# Patient Record
Sex: Female | Born: 1965 | Race: White | Hispanic: No | Marital: Married | Smoking: Former smoker
Health system: Southern US, Community
[De-identification: ages and names within clinical notes are randomized; demographics above are authoritative.]

## PROBLEM LIST (undated history)

## (undated) DIAGNOSIS — I959 Hypotension, unspecified: Secondary | ICD-10-CM

## (undated) DIAGNOSIS — E079 Disorder of thyroid, unspecified: Secondary | ICD-10-CM

## (undated) DIAGNOSIS — M8580 Other specified disorders of bone density and structure, unspecified site: Secondary | ICD-10-CM

## (undated) HISTORY — PX: TONSILLECTOMY: SUR1361

## (undated) HISTORY — PX: BUNIONECTOMY: SHX129

## (undated) HISTORY — DX: Disorder of thyroid, unspecified: E07.9

## (undated) HISTORY — DX: Other specified disorders of bone density and structure, unspecified site: M85.80

## (undated) HISTORY — PX: OTHER SURGICAL HISTORY: SHX169

## (undated) HISTORY — DX: Hypotension, unspecified: I95.9

---

## 1999-09-24 ENCOUNTER — Other Ambulatory Visit: Admission: RE | Admit: 1999-09-24 | Discharge: 1999-09-24 | Payer: Self-pay | Admitting: Gynecology

## 2001-04-13 ENCOUNTER — Encounter: Payer: Self-pay | Admitting: Gynecology

## 2001-04-13 ENCOUNTER — Encounter: Admission: RE | Admit: 2001-04-13 | Discharge: 2001-04-13 | Payer: Self-pay | Admitting: Gynecology

## 2002-12-16 ENCOUNTER — Other Ambulatory Visit: Admission: RE | Admit: 2002-12-16 | Discharge: 2002-12-16 | Payer: Self-pay | Admitting: Gynecology

## 2003-12-09 ENCOUNTER — Other Ambulatory Visit: Admission: RE | Admit: 2003-12-09 | Discharge: 2003-12-09 | Payer: Self-pay | Admitting: Gynecology

## 2004-04-01 ENCOUNTER — Encounter: Admission: RE | Admit: 2004-04-01 | Discharge: 2004-04-01 | Payer: Self-pay | Admitting: Gynecology

## 2004-12-06 ENCOUNTER — Other Ambulatory Visit: Admission: RE | Admit: 2004-12-06 | Discharge: 2004-12-06 | Payer: Self-pay | Admitting: Gynecology

## 2005-04-11 ENCOUNTER — Encounter: Admission: RE | Admit: 2005-04-11 | Discharge: 2005-04-11 | Payer: Self-pay | Admitting: Gynecology

## 2005-04-20 ENCOUNTER — Encounter: Admission: RE | Admit: 2005-04-20 | Discharge: 2005-04-20 | Payer: Self-pay | Admitting: Gynecology

## 2006-05-16 ENCOUNTER — Encounter: Admission: RE | Admit: 2006-05-16 | Discharge: 2006-05-16 | Payer: Self-pay | Admitting: Specialist

## 2007-07-12 ENCOUNTER — Encounter: Admission: RE | Admit: 2007-07-12 | Discharge: 2007-07-12 | Payer: Self-pay | Admitting: Family Medicine

## 2008-05-08 ENCOUNTER — Other Ambulatory Visit: Admission: RE | Admit: 2008-05-08 | Discharge: 2008-05-08 | Payer: Self-pay | Admitting: Obstetrics and Gynecology

## 2008-07-22 ENCOUNTER — Encounter: Admission: RE | Admit: 2008-07-22 | Discharge: 2008-07-22 | Payer: Self-pay | Admitting: Family Medicine

## 2009-07-08 ENCOUNTER — Encounter: Admission: RE | Admit: 2009-07-08 | Discharge: 2009-07-08 | Payer: Self-pay | Admitting: Specialist

## 2010-07-09 ENCOUNTER — Encounter: Admission: RE | Admit: 2010-07-09 | Discharge: 2010-07-09 | Payer: Self-pay | Admitting: Specialist

## 2011-10-07 ENCOUNTER — Other Ambulatory Visit: Payer: Self-pay | Admitting: Specialist

## 2011-10-07 DIAGNOSIS — Z1231 Encounter for screening mammogram for malignant neoplasm of breast: Secondary | ICD-10-CM

## 2011-11-07 ENCOUNTER — Ambulatory Visit
Admission: RE | Admit: 2011-11-07 | Discharge: 2011-11-07 | Disposition: A | Discharge status: Home / Self Care | Payer: BC Managed Care – PPO | Source: Ambulatory Visit | Attending: Specialist | Admitting: Specialist

## 2011-11-07 ENCOUNTER — Ambulatory Visit: Payer: Self-pay

## 2011-11-07 DIAGNOSIS — Z1231 Encounter for screening mammogram for malignant neoplasm of breast: Secondary | ICD-10-CM

## 2012-02-02 ENCOUNTER — Ambulatory Visit
Admission: RE | Admit: 2012-02-02 | Discharge: 2012-02-02 | Disposition: A | Discharge status: Home / Self Care | Payer: BC Managed Care – PPO | Source: Ambulatory Visit | Attending: Otolaryngology | Admitting: Otolaryngology

## 2012-02-02 ENCOUNTER — Other Ambulatory Visit: Payer: Self-pay | Admitting: Otolaryngology

## 2012-02-02 DIAGNOSIS — R0981 Nasal congestion: Secondary | ICD-10-CM

## 2012-10-10 ENCOUNTER — Other Ambulatory Visit: Payer: Self-pay | Admitting: Specialist

## 2012-10-10 DIAGNOSIS — Z803 Family history of malignant neoplasm of breast: Secondary | ICD-10-CM

## 2012-10-10 DIAGNOSIS — Z1231 Encounter for screening mammogram for malignant neoplasm of breast: Secondary | ICD-10-CM

## 2012-11-23 ENCOUNTER — Ambulatory Visit: Payer: BC Managed Care – PPO

## 2012-12-27 ENCOUNTER — Telehealth: Payer: Self-pay | Admitting: Nurse Practitioner

## 2012-12-28 ENCOUNTER — Other Ambulatory Visit: Payer: Self-pay | Admitting: Nurse Practitioner

## 2012-12-28 MED ORDER — LEVOTHYROXINE SODIUM 125 MCG PO TABS: 125.0000 ug | ORAL_TABLET | Freq: Every day | ORAL | Status: DC

## 2012-12-28 NOTE — Telephone Encounter (Signed)
Levothyroxine level increased to - Need to recheck labs in 3 months

## 2012-12-28 NOTE — Telephone Encounter (Signed)
LABS ARE ON YOUR SHELF

## 2012-12-28 NOTE — Telephone Encounter (Signed)
Chart on desk

## 2012-12-28 NOTE — Telephone Encounter (Signed)
Need to see chart. Is patient already on thyroid medicine

## 2012-12-28 NOTE — Telephone Encounter (Signed)
PT AWARE  

## 2013-01-03 ENCOUNTER — Other Ambulatory Visit: Payer: Self-pay | Admitting: Nurse Practitioner

## 2013-01-03 ENCOUNTER — Ambulatory Visit
Admission: RE | Admit: 2013-01-03 | Discharge: 2013-01-03 | Disposition: A | Discharge status: Home / Self Care | Payer: BC Managed Care – PPO | Source: Ambulatory Visit | Attending: Specialist | Admitting: Specialist

## 2013-01-03 ENCOUNTER — Other Ambulatory Visit (HOSPITAL_COMMUNITY)
Admission: RE | Admit: 2013-01-03 | Discharge: 2013-01-03 | Disposition: A | Discharge status: Home / Self Care | Payer: BC Managed Care – PPO | Source: Ambulatory Visit | Attending: Obstetrics and Gynecology | Admitting: Obstetrics and Gynecology

## 2013-01-03 DIAGNOSIS — Z1231 Encounter for screening mammogram for malignant neoplasm of breast: Secondary | ICD-10-CM

## 2013-01-03 DIAGNOSIS — Z1151 Encounter for screening for human papillomavirus (HPV): Secondary | ICD-10-CM | POA: Insufficient documentation

## 2013-01-03 DIAGNOSIS — Z01419 Encounter for gynecological examination (general) (routine) without abnormal findings: Secondary | ICD-10-CM | POA: Insufficient documentation

## 2013-01-03 DIAGNOSIS — Z803 Family history of malignant neoplasm of breast: Secondary | ICD-10-CM

## 2013-01-09 ENCOUNTER — Other Ambulatory Visit: Payer: Self-pay | Admitting: Dermatology

## 2013-04-24 ENCOUNTER — Other Ambulatory Visit (INDEPENDENT_AMBULATORY_CARE_PROVIDER_SITE_OTHER): Payer: BC Managed Care – PPO

## 2013-04-24 DIAGNOSIS — E039 Hypothyroidism, unspecified: Secondary | ICD-10-CM

## 2013-04-25 LAB — TSH: TSH: 1.75 u[IU]/mL (ref 0.450–4.500)

## 2013-04-30 ENCOUNTER — Telehealth: Payer: Self-pay | Admitting: Nurse Practitioner

## 2013-04-30 NOTE — Telephone Encounter (Signed)
Patient aware of labs please sent thyroid med to [facility] pLEASE SEND 3 MTHS

## 2013-05-01 MED ORDER — LEVOTHYROXINE SODIUM 125 MCG PO TABS: 125.0000 ug | ORAL_TABLET | Freq: Every day | ORAL | Status: DC

## 2013-05-01 NOTE — Telephone Encounter (Signed)
Thyroid med rx sent to [facility]

## 2013-05-11 ENCOUNTER — Encounter: Payer: Self-pay | Admitting: Family Medicine

## 2013-05-11 ENCOUNTER — Ambulatory Visit (INDEPENDENT_AMBULATORY_CARE_PROVIDER_SITE_OTHER): Payer: BC Managed Care – PPO | Admitting: Family Medicine

## 2013-05-11 VITALS — BP 111/70 | HR 62 | Temp 99.4°F | Ht 65.0 in | Wt 127.0 lb

## 2013-05-11 DIAGNOSIS — J309 Allergic rhinitis, unspecified: Secondary | ICD-10-CM

## 2013-05-11 DIAGNOSIS — J302 Other seasonal allergic rhinitis: Secondary | ICD-10-CM

## 2013-05-11 DIAGNOSIS — J329 Chronic sinusitis, unspecified: Secondary | ICD-10-CM

## 2013-05-11 MED ORDER — AMOXICILLIN-POT CLAVULANATE 875-125 MG PO TABS: 1.0000 | ORAL_TABLET | Freq: Two times a day (BID) | ORAL | Status: DC

## 2013-05-11 MED ORDER — MONTELUKAST SODIUM 10 MG PO TABS: 10.0000 mg | ORAL_TABLET | Freq: Every day | ORAL | Status: DC

## 2013-05-11 MED ORDER — METHYLPREDNISOLONE (PAK) 4 MG PO TABS: ORAL_TABLET | ORAL | Status: DC

## 2013-05-11 MED ORDER — FLUCONAZOLE 150 MG PO TABS: 150.0000 mg | ORAL_TABLET | Freq: Once | ORAL | Status: DC

## 2013-05-11 NOTE — Patient Instructions (Signed)

## 2013-05-11 NOTE — Progress Notes (Signed)
  Subjective:    Patient ID: DENNY LAVE, female    DOB: 1966/02/26, 46 y.o.   MRN: [id number]  HPI This 47 y.o. female presents for evaluation of sinus congestion and fever.  She has had URI Sx's for over 2 weeks now..   Review of Systems C/o uri sx's No chest pain, SOB, HA, dizziness, vision change, N/V, diarrhea, constipation, dysuria, urinary urgency or frequency, myalgias, arthralgias or rash.     Objective:   Physical Exam  Vital signs noted  Well developed well nourished female.  HEENT - Head atraumatic Normocephalic                Eyes - PERRLA, Conjuctiva - clear Sclera- Clear EOMI                Ears - EAC's Wnl TM's Wnl Gross Hearing WNL                Nose - Nares patent                 Throat - oropharanx wnl Respiratory - Lungs CTA bilateral Cardiac - RRR S1 and S2 without murmur GI - Abdomen soft Nontender and bowel sounds active x 4 Extremities - No edema. Neuro - Grossly intact.      Assessment & Plan:  Sinusitis - Plan: amoxicillin-clavulanate (AUGMENTIN) 875-125 MG per tablet, methylPREDNIsolone (MEDROL DOSPACK) 4 MG tablet, montelukast (SINGULAIR) 10 MG tablet  Seasonal allergies - Plan: montelukast (SINGULAIR) 10 MG tablet

## 2013-06-08 ENCOUNTER — Ambulatory Visit (INDEPENDENT_AMBULATORY_CARE_PROVIDER_SITE_OTHER): Payer: BC Managed Care – PPO | Admitting: Family Medicine

## 2013-06-08 VITALS — BP 128/81 | HR 87 | Temp 97.9°F | Ht 65.0 in | Wt 122.0 lb

## 2013-06-08 DIAGNOSIS — J329 Chronic sinusitis, unspecified: Secondary | ICD-10-CM

## 2013-06-08 MED ORDER — AMOXICILLIN-POT CLAVULANATE 875-125 MG PO TABS: 1.0000 | ORAL_TABLET | Freq: Two times a day (BID) | ORAL | Status: DC

## 2013-06-08 MED ORDER — METHYLPREDNISOLONE (PAK) 4 MG PO TABS: ORAL_TABLET | ORAL | Status: DC

## 2013-06-08 NOTE — Progress Notes (Signed)
  Subjective:    Patient ID: April Herrera, female    DOB: 09-24-1965, 47 y.o.   MRN: [id number]  HPI This 47 y.o. female presents for evaluation of uri sx's for over a week.  She  Was tx'd remotely for sinus infection.  She has been having allergy difficulties Since moving to [place].   Review of Systems C/o uri sx's and allergies. No chest pain, SOB, HA, dizziness, vision change, N/V, diarrhea, constipation, dysuria, urinary urgency or frequency, myalgias, arthralgias or rash.     Objective:   Physical Exam  Vital signs noted  Well developed well nourished female.  HEENT - Head atraumatic Normocephalic                Eyes - PERRLA, Conjuctiva - clear Sclera- Clear EOMI                Ears - EAC's Wnl TM's Wnl Gross Hearing WNL                Nose - Nares patent                 Throat - oropharanx wnl                 Face - TTP maxillary sinuses. Respiratory - Lungs CTA bilateral Cardiac - RRR S1 and S2 without murmur GI - Abdomen soft Nontender and bowel sounds active x 4 Extremities - No edema. Neuro - Grossly intact.      Assessment & Plan:  Sinusitis - Plan: amoxicillin-clavulanate (AUGMENTIN) 875-125 MG per tablet, methylPREDNIsolone (MEDROL DOSPACK) 4 MG tablet.  May need referral to ENT if infections persist.  Encouraged her To continue singulair, otc allegra D, and follow up prn if sx's continue or persist.

## 2013-06-08 NOTE — Patient Instructions (Signed)

## 2013-07-23 ENCOUNTER — Telehealth: Payer: Self-pay | Admitting: Family Medicine

## 2013-07-23 ENCOUNTER — Encounter (INDEPENDENT_AMBULATORY_CARE_PROVIDER_SITE_OTHER): Payer: Self-pay

## 2013-07-23 ENCOUNTER — Ambulatory Visit (INDEPENDENT_AMBULATORY_CARE_PROVIDER_SITE_OTHER): Payer: BC Managed Care – PPO | Admitting: Family Medicine

## 2013-07-23 ENCOUNTER — Encounter: Payer: Self-pay | Admitting: Family Medicine

## 2013-07-23 VITALS — BP 118/73 | HR 67 | Temp 98.1°F | Ht 65.0 in | Wt 123.0 lb

## 2013-07-23 DIAGNOSIS — H60399 Other infective otitis externa, unspecified ear: Secondary | ICD-10-CM

## 2013-07-23 DIAGNOSIS — R062 Wheezing: Secondary | ICD-10-CM

## 2013-07-23 DIAGNOSIS — J329 Chronic sinusitis, unspecified: Secondary | ICD-10-CM

## 2013-07-23 DIAGNOSIS — H6092 Unspecified otitis externa, left ear: Secondary | ICD-10-CM

## 2013-07-23 MED ORDER — NEOMYCIN-POLYMYXIN-HC 3.5-10000-1 OT SOLN: 3.0000 [drp] | Freq: Four times a day (QID) | OTIC | Status: DC

## 2013-07-23 MED ORDER — AZITHROMYCIN 250 MG PO TABS: ORAL_TABLET | ORAL | Status: DC

## 2013-07-23 MED ORDER — ALBUTEROL SULFATE HFA 108 (90 BASE) MCG/ACT IN AERS: 2.0000 | INHALATION_SPRAY | RESPIRATORY_TRACT | Status: DC | PRN

## 2013-07-23 MED ORDER — METHYLPREDNISOLONE ACETATE 40 MG/ML IJ SUSP: 80.0000 mg | Freq: Once | INTRAMUSCULAR | Status: DC

## 2013-07-23 MED ORDER — METHYLPREDNISOLONE ACETATE 80 MG/ML IJ SUSP
80.0000 mg | Freq: Once | INTRAMUSCULAR | Status: AC
  Administered 2013-07-23: 80 mg via INTRAMUSCULAR

## 2013-07-23 NOTE — Telephone Encounter (Signed)
Appt given for today 

## 2013-07-23 NOTE — Patient Instructions (Signed)
Methylprednisolone Suspension for Injection What is this medicine? METHYLPREDNISOLONE (meth ill pred NISS oh lone) is a corticosteroid. It is commonly used to treat inflammation of the skin, joints, lungs, and other organs. Common conditions treated include asthma, allergies, and arthritis. It is also used for other conditions, such as blood disorders and diseases of the adrenal glands. This medicine may be used for other purposes; ask your health care provider or pharmacist if you have questions. What should I tell my health care provider before I take this medicine? They need to know if you have any of these conditions: -cataracts or glaucoma -Cushings -heart disease -high blood pressure -infection including tuberculosis -low calcium or potassium levels in the blood -recent surgery -seizures -stomach or intestinal disease, including colitis -threadworms -thyroid problems -an unusual or allergic reaction to methylprednisolone, corticosteroids, benzyl alcohol, other medicines, foods, dyes, or preservatives -pregnant or trying to get pregnant -breast-feeding How should I use this medicine? This medicine is for injection into a muscle, joint, or other tissue. It is given by a health care professional in a hospital or clinic setting. Talk to your pediatrician regarding the use of this medicine in children. While this drug may be prescribed for selected conditions, precautions do apply. Overdosage: If you think you have taken too much of this medicine contact a poison control center or emergency room at once. NOTE: This medicine is only for you. Do not share this medicine with others. What if I miss a dose? This does not apply. What may interact with this medicine? Do not take this medicine with any of the following medications: -mifepristone -radiopaque contrast agents This medicine may also interact with the following medications: -aspirin and aspirin-like  medicines -cyclosporin -ketoconazole -phenobarbital -phenytoin -rifampin -tacrolimus -troleandomycin -vaccines -warfarin This list may not describe all possible interactions. Give your health care provider a list of all the medicines, herbs, non-prescription drugs, or dietary supplements you use. Also tell them if you smoke, drink alcohol, or use illegal drugs. Some items may interact with your medicine. What should I watch for while using this medicine? Visit your doctor or health care professional for regular checks on your progress. If you are taking this medicine for a long time, carry an identification card with your name and address, the type and dose of your medicine, and your doctor's name and address. The medicine may increase your risk of getting an infection. Stay away from people who are sick. Tell your doctor or health care professional if you are around anyone with measles or chickenpox. You may need to avoid some vaccines. Talk to your health care provider for more information. If you are going to have surgery, tell your doctor or health care professional that you have taken this medicine within the last twelve months. Ask your doctor or health care professional about your diet. You may need to lower the amount of salt you eat. The medicine can increase your blood sugar. If you are a diabetic check with your doctor if you need help adjusting the dose of your diabetic medicine. What side effects may I notice from receiving this medicine? Side effects that you should report to your doctor or health care professional as soon as possible: -allergic reactions like skin rash, itching or hives, swelling of the face, lips, or tongue -bloody or tarry stools -changes in vision -eye pain or bulging eyes -fever, sore throat, sneezing, cough, or other signs of infection, wounds that will not heal -increased thirst -irregular heartbeat -muscle cramps -pain   in hips, back, ribs, arms,  shoulders, or legs -swelling of the ankles, feet, hands -trouble passing urine or change in the amount of urine -unusual bleeding or bruising -unusually weak or tired -weight gain or weight loss Side effects that usually do not require medical attention (report to your doctor or health care professional if they continue or are bothersome): -changes in emotions or moods -constipation or diarrhea -headache -irritation at site where injected -nausea, vomiting -skin problems, acne, thin and shiny skin -trouble sleeping -unusual hair growth on the face or body This list may not describe all possible side effects. Call your doctor for medical advice about side effects. You may report side effects to FDA at 1-800-FDA-1088. Where should I keep my medicine? This drug is given in a hospital or clinic and will not be stored at home. NOTE: This sheet is a summary. It may not cover all possible information. If you have questions about this medicine, talk to your doctor, pharmacist, or health care provider.  2013, Elsevier/Gold Standard. (03/26/2008 2:36:31 PM)  

## 2013-07-23 NOTE — Progress Notes (Signed)
  Subjective:    Patient ID: April Herrera, female    DOB: [birth year], 47 y.o.   MRN: [id number]  HPI SINUSITIS Onset:  1-2 weeks  Location: bilateral maxillary sinuses, L>R Description:sinus pressure, mucopurulent drainage- mild URI 1-2 weeks prios to sxs   Modifying factors: mild wheezing/ches tighness in setting of prior 15 pack year smoking history.   Symptoms Cough:  yes Discharge:  yes Fever: no Sinus Pressure:  yes Ears Blocked:  yes Teeth Ache:  Minimal  Frontal Headache:  yes Second Sickening:  Within grey zone   Red Flags Change in mental state: no Change in vision: no   Review of Systems  All other systems reviewed and are negative.       Objective:   Physical Exam  Constitutional: She appears well-developed and well-nourished.  HENT:  Head: Normocephalic and atraumatic.  Right Ear: External ear normal.  L ear canal erythema and tenderness to otoscopic evaluation.  Bilateral maxillary sinus TTP +nasal erythema, rhinorrhea bilaterally, + post oropharyngeal erythema    Eyes: Conjunctivae are normal. Pupils are equal, round, and reactive to light.  Neck: Normal range of motion. Neck supple.  Cardiovascular: Normal rate and regular rhythm.   Pulmonary/Chest: Effort normal.  Faint wheezing in apices    Abdominal: Soft.  Musculoskeletal: Normal range of motion.  Lymphadenopathy:    She has no cervical adenopathy.  Neurological: She is alert.  Skin: Skin is warm.          Assessment & Plan:  Sinusitis - Plan: azithromycin (ZITHROMAX) 250 MG tablet  Wheezing - Plan: methylPREDNISolone acetate (DEPO-MEDROL) injection 80 mg  OE (otitis externa), left - Plan: neomycin-polymyxin-hydrocortisone (CORTISPORIN) otic solution  Treatment plan as above.  Discussed supportive care and infectious/resp/ENT red flags.  Consider PFTs if wheezing/chest tightness persist with resolution of sinusitis in 2-3 months.  Prn albuterol RX given.  Restart allergic  rhinitis regimen.  Follow up as needed.

## 2013-12-09 ENCOUNTER — Telehealth: Payer: Self-pay | Admitting: Family Medicine

## 2013-12-09 NOTE — Telephone Encounter (Signed)
 SinkRita will call pt

## 2014-02-28 IMAGING — MG MM DIGITAL SCREENING BILAT W/ CAD
5 series · 5 of 5 positions shown · non-contrast
Comparison: Previous exams.

CLINICAL DATA: Screening.

DIGITAL BILATERAL SCREENING MAMMOGRAM WITH CAD

[R CC]
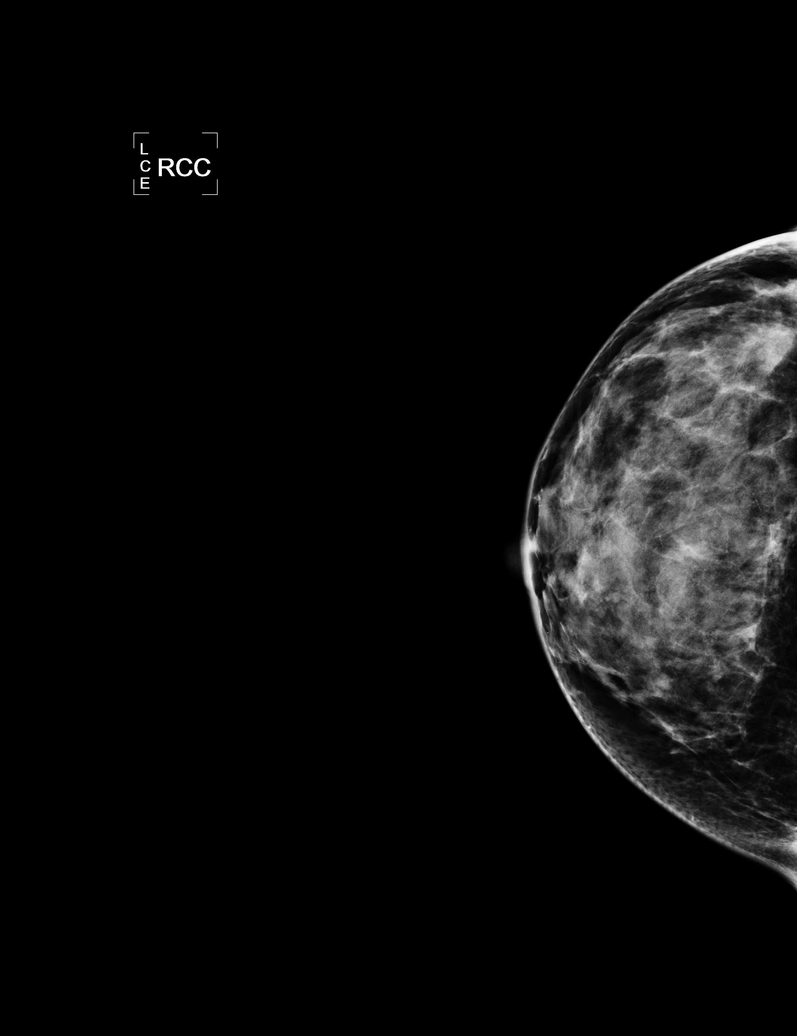

[L CC]
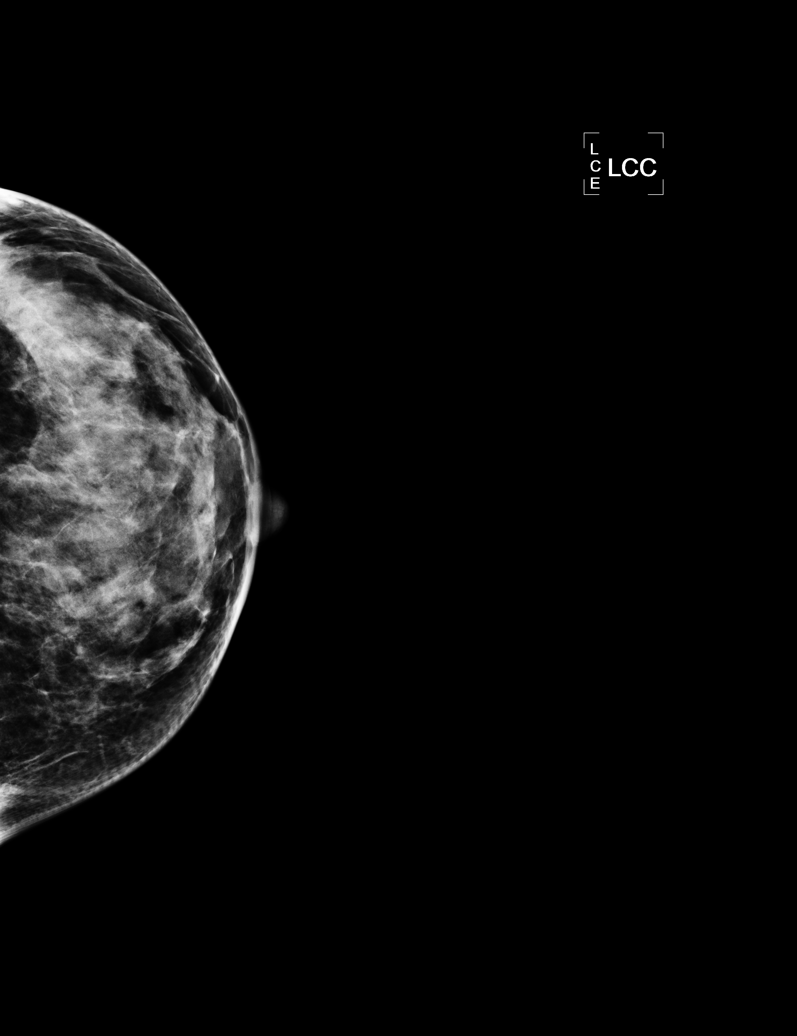

[L MLO]
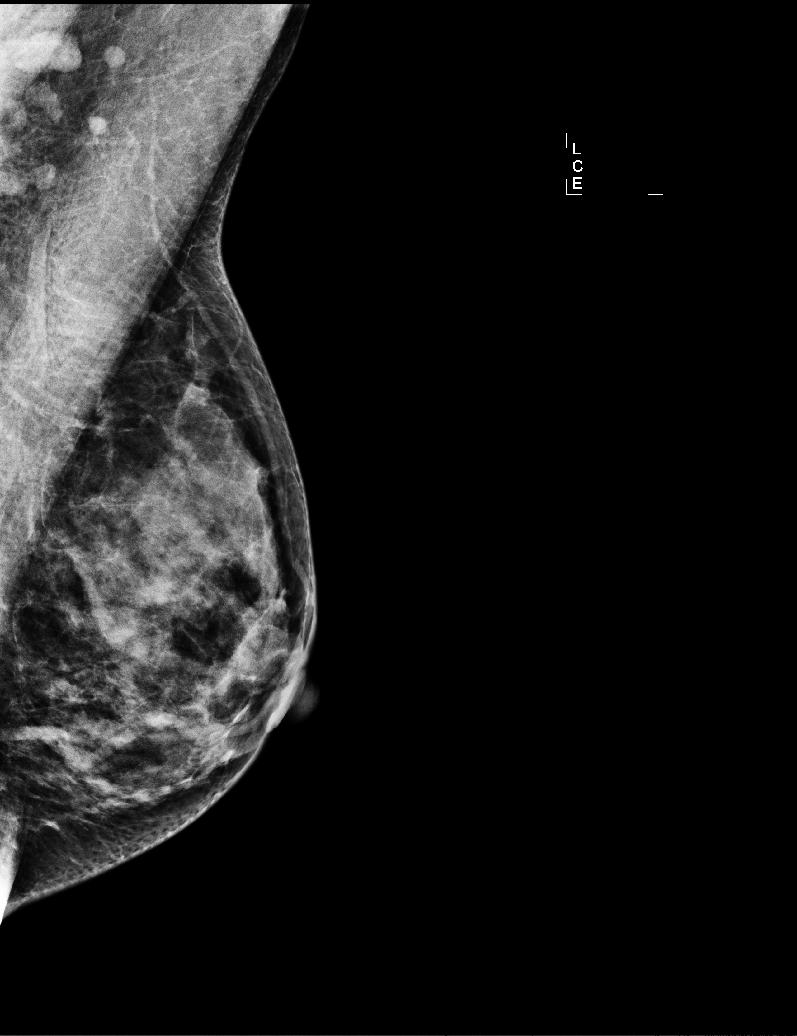

[R MLO (1 of 2)]
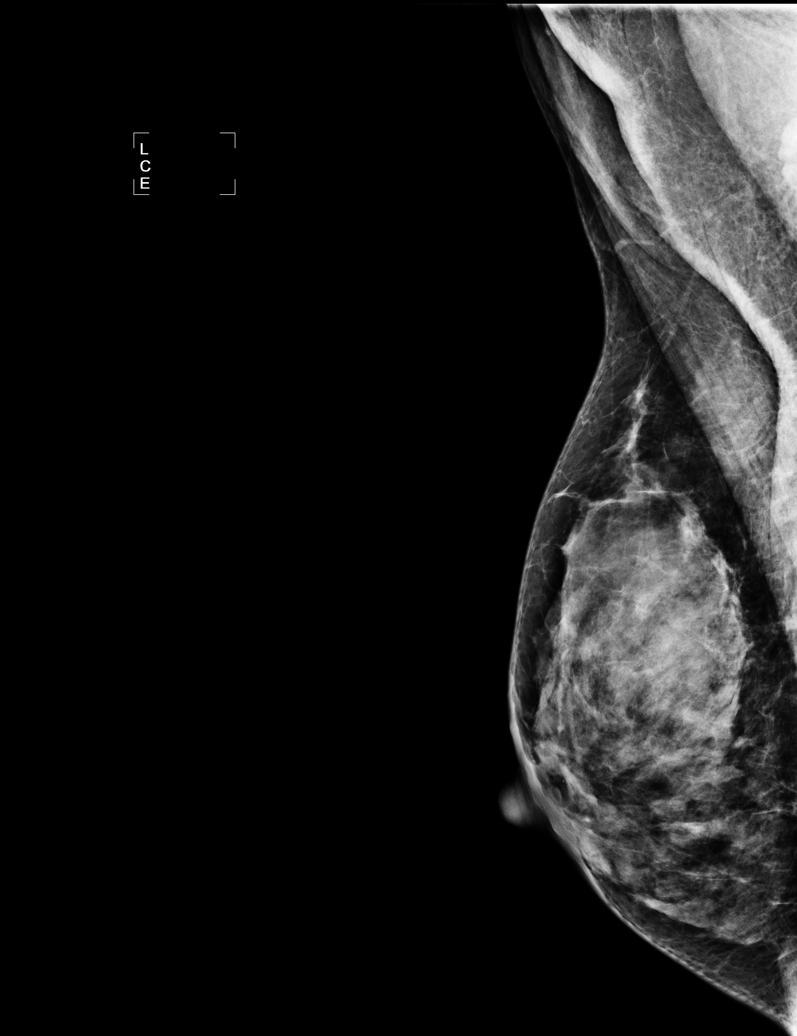

[R MLO (2 of 2)]
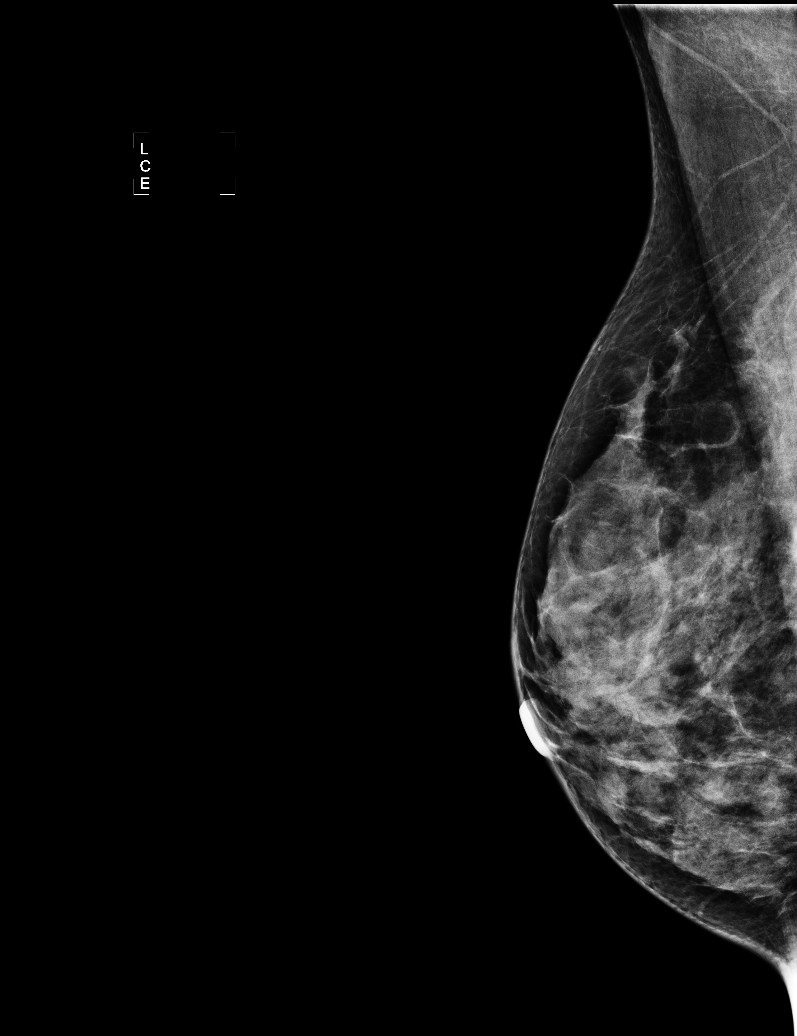

[5 of 5 positions shown; findings below may reference images not displayed]

FINDINGS: ACR Breast Density Category 3: The breast tissue is heterogeneously
dense.

No suspicious masses, architectural distortion, or calcifications
are present.

Images were processed with CAD.
IMPRESSION: No mammographic evidence of malignancy.

A result letter of this screening mammogram will be mailed directly
to the patient.

RECOMMENDATION:
Screening mammogram in one year. (Code:S5-2-VZT)

BI-RADS CATEGORY 1:  Negative.

## 2014-03-03 ENCOUNTER — Other Ambulatory Visit: Payer: Self-pay | Admitting: *Deleted

## 2014-03-03 ENCOUNTER — Telehealth: Payer: Self-pay | Admitting: Family Medicine

## 2014-03-03 DIAGNOSIS — J329 Chronic sinusitis, unspecified: Secondary | ICD-10-CM

## 2014-03-03 MED ORDER — LEVOTHYROXINE SODIUM 125 MCG PO TABS: 125.0000 ug | ORAL_TABLET | Freq: Every day | ORAL | Status: DC

## 2014-03-03 MED ORDER — AZITHROMYCIN 250 MG PO TABS: ORAL_TABLET | ORAL | Status: DC

## 2014-03-03 NOTE — Telephone Encounter (Signed)
One refill sent in

## 2014-03-03 NOTE — Telephone Encounter (Signed)
Do not do refills on antibiotics- NTBS  

## 2014-03-03 NOTE — Telephone Encounter (Signed)
Last ov 11/15

## 2014-03-06 ENCOUNTER — Other Ambulatory Visit: Payer: Self-pay | Admitting: Nurse Practitioner

## 2014-03-19 ENCOUNTER — Other Ambulatory Visit: Payer: Self-pay

## 2014-03-19 DIAGNOSIS — Z803 Family history of malignant neoplasm of breast: Secondary | ICD-10-CM

## 2014-03-19 DIAGNOSIS — Z1231 Encounter for screening mammogram for malignant neoplasm of breast: Secondary | ICD-10-CM

## 2014-03-28 ENCOUNTER — Ambulatory Visit
Admission: RE | Admit: 2014-03-28 | Discharge: 2014-03-28 | Disposition: A | Discharge status: Home / Self Care | Payer: BC Managed Care – PPO | Source: Ambulatory Visit

## 2014-03-28 DIAGNOSIS — Z1231 Encounter for screening mammogram for malignant neoplasm of breast: Secondary | ICD-10-CM

## 2014-03-28 DIAGNOSIS — Z803 Family history of malignant neoplasm of breast: Secondary | ICD-10-CM

## 2014-04-01 ENCOUNTER — Encounter: Payer: Self-pay | Admitting: Family Medicine

## 2014-04-01 ENCOUNTER — Ambulatory Visit (INDEPENDENT_AMBULATORY_CARE_PROVIDER_SITE_OTHER): Payer: BC Managed Care – PPO | Admitting: Family Medicine

## 2014-04-01 VITALS — BP 120/76 | HR 60 | Temp 99.1°F | Ht 65.0 in | Wt 122.4 lb

## 2014-04-01 DIAGNOSIS — E039 Hypothyroidism, unspecified: Secondary | ICD-10-CM

## 2014-04-01 DIAGNOSIS — F411 Generalized anxiety disorder: Secondary | ICD-10-CM

## 2014-04-01 DIAGNOSIS — E559 Vitamin D deficiency, unspecified: Secondary | ICD-10-CM

## 2014-04-01 NOTE — Progress Notes (Signed)
   Subjective:    Patient ID: April Herrera, female    DOB: 04/01/1966, 48 y.o.   MRN: [id number]  HPI This 48 y.o. female presents for evaluation of hypothyroidism. She has hx of vit D def and she has Family hx of osteoporosis.   Review of Systems No chest pain, SOB, HA, dizziness, vision change, N/V, diarrhea, constipation, dysuria, urinary urgency or frequency, myalgias, arthralgias or rash.     Objective:   Physical Exam Vital signs noted  Well developed well nourished female.  HEENT - Head atraumatic Normocephalic                Eyes - PERRLA, Conjuctiva - clear Sclera- Clear EOMI                Ears - EAC's Wnl TM's Wnl Gross Hearing WNL                Nose - Nares patent                 Throat - oropharanx wnl Respiratory - Lungs CTA bilateral Cardiac - RRR S1 and S2 without murmur GI - Abdomen soft Nontender and bowel sounds active x 4 Extremities - No edema. Neuro - Grossly intact.       Assessment & Plan:  Unspecified hypothyroidism - Plan: Thyroid Panel With TSH, Lipid panel, CANCELED: Thyroid Panel With TSH  Anxiety state, unspecified - Plan: Thyroid Panel With TSH, Lipid panel  Unspecified vitamin D deficiency - Plan: Vit D  25 hydroxy (rtn osteoporosis monitoring)  Deatra CanterWilliam J Kwan Shellhammer FNP

## 2014-04-02 ENCOUNTER — Other Ambulatory Visit: Payer: Self-pay | Admitting: Family Medicine

## 2014-04-02 LAB — LIPID PANEL
Chol/HDL Ratio: 2.5 ratio units (ref 0.0–4.4)
Cholesterol, Total: 160 mg/dL (ref 100–199)
HDL: 64 mg/dL (ref >39)
LDL Calculated: 76 mg/dL (ref 0–99)
Triglycerides: 102 mg/dL (ref 0–149)
VLDL Cholesterol Cal: 20 mg/dL (ref 5–40)

## 2014-04-02 LAB — THYROID PANEL WITH TSH
Free Thyroxine Index: 3.3 (ref 1.2–4.9)
T3 Uptake Ratio: 25 % (ref 24–39)
T4, Total: 13.1 ug/dL — ABNORMAL HIGH (ref 4.5–12.0)
TSH: 0.254 u[IU]/mL — ABNORMAL LOW (ref 0.450–4.500)

## 2014-04-02 LAB — VITAMIN D 25 HYDROXY (VIT D DEFICIENCY, FRACTURES): Vit D, 25-Hydroxy: 59.3 ng/mL (ref 30.0–100.0)

## 2014-04-02 MED ORDER — LEVOTHYROXINE SODIUM 100 MCG PO TABS: 100.0000 ug | ORAL_TABLET | Freq: Every day | ORAL | Status: DC

## 2014-06-30 ENCOUNTER — Telehealth: Payer: Self-pay | Admitting: Family Medicine

## 2014-06-30 ENCOUNTER — Other Ambulatory Visit: Payer: Self-pay | Admitting: Family Medicine

## 2014-06-30 DIAGNOSIS — E038 Other specified hypothyroidism: Secondary | ICD-10-CM

## 2014-06-30 NOTE — Telephone Encounter (Signed)
Order for thyroid panel put in and patient called and advised to come and do lab and no appointment necessary.

## 2014-06-30 NOTE — Telephone Encounter (Signed)
Please advise 

## 2014-07-01 ENCOUNTER — Other Ambulatory Visit (INDEPENDENT_AMBULATORY_CARE_PROVIDER_SITE_OTHER): Payer: BC Managed Care – PPO

## 2014-07-01 DIAGNOSIS — E038 Other specified hypothyroidism: Secondary | ICD-10-CM

## 2014-07-01 NOTE — Progress Notes (Signed)
Lab only 

## 2014-07-02 ENCOUNTER — Other Ambulatory Visit: Payer: BC Managed Care – PPO

## 2014-07-02 ENCOUNTER — Ambulatory Visit: Payer: BC Managed Care – PPO

## 2014-07-02 LAB — THYROID PANEL WITH TSH
Free Thyroxine Index: 2.5 (ref 1.2–4.9)
T3 Uptake Ratio: 24 % (ref 24–39)
T4, Total: 10.6 ug/dL (ref 4.5–12.0)
TSH: 10.71 u[IU]/mL — ABNORMAL HIGH (ref 0.450–4.500)

## 2014-07-03 ENCOUNTER — Telehealth: Payer: Self-pay | Admitting: Family Medicine

## 2014-07-03 ENCOUNTER — Other Ambulatory Visit: Payer: Self-pay | Admitting: Family Medicine

## 2014-07-04 MED ORDER — LEVOTHYROXINE SODIUM 125 MCG PO TABS: 125.0000 ug | ORAL_TABLET | Freq: Every day | ORAL | Status: DC

## 2014-07-04 NOTE — Telephone Encounter (Signed)
Dosage adjusted and sent to pharmacy.

## 2014-07-04 NOTE — Telephone Encounter (Signed)
Left message to call back and let us know if she is taking her levothyroxine regularly.  We will adjust medication accordingly.

## 2014-07-07 ENCOUNTER — Telehealth: Payer: Self-pay | Admitting: Family Medicine

## 2014-07-07 ENCOUNTER — Other Ambulatory Visit: Payer: Self-pay | Admitting: Family Medicine

## 2014-07-07 DIAGNOSIS — E038 Other specified hypothyroidism: Secondary | ICD-10-CM

## 2014-07-07 MED ORDER — LEVOTHYROXINE SODIUM 112 MCG PO TABS: 112.0000 ug | ORAL_TABLET | Freq: Every day | ORAL | Status: DC

## 2014-07-07 NOTE — Telephone Encounter (Signed)
Sent in her levothyroxine to pharm

## 2014-08-13 ENCOUNTER — Ambulatory Visit (INDEPENDENT_AMBULATORY_CARE_PROVIDER_SITE_OTHER): Payer: BC Managed Care – PPO

## 2014-08-13 ENCOUNTER — Ambulatory Visit (INDEPENDENT_AMBULATORY_CARE_PROVIDER_SITE_OTHER): Payer: BC Managed Care – PPO | Admitting: Pharmacist

## 2014-08-13 ENCOUNTER — Encounter: Payer: Self-pay | Admitting: Pharmacist

## 2014-08-13 VITALS — Ht 65.0 in | Wt 120.0 lb

## 2014-08-13 DIAGNOSIS — M8580 Other specified disorders of bone density and structure, unspecified site: Secondary | ICD-10-CM

## 2014-08-13 DIAGNOSIS — Z13820 Encounter for screening for osteoporosis: Secondary | ICD-10-CM

## 2014-08-13 DIAGNOSIS — E559 Vitamin D deficiency, unspecified: Secondary | ICD-10-CM

## 2014-08-13 LAB — HM DEXA SCAN

## 2014-08-13 NOTE — Progress Notes (Signed)
Patient ID: April Herrera, female   DOB: 04/14/1966, 48 y.o.   MRN: [id number]  [facility] Current Height: Height: 5\' 5"  (165.1 cm)      Max Lifetime Height:  5' 5.5 '  Current Weight: Weight: 120 lb (54.432 kg)       Ethnicity:Caucasian    HPI: Does pt already have a diagnosis of:  Osteopenia?  No Osteoporosis?  No  Back Pain?  No       Kyphosis?  No Prior fracture?  Yes  But none as an adult - broke elbow at 48yo and ankle at 48yo Med(s) for Osteoporosis/Osteopenia:  none Med(s) previously tried for Osteoporosis/Osteopenia:  none                                                             PMH: Age at menopause:  48yo Hysterectomy?  No Oophorectomy?  No HRT? Yes - Current.  Type/duration: BCP Steroid Use?  No Thyroid med?  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  No Current or previous eating disorders?  No Last Vitamin D Result:  59.3 (04/01/2014) Last GFR Result:  No recent BMP   FH/SH: Family history of osteoporosis?  Yes - mother Parent with history of hip fracture?  No Family history of breast cancer?  Yes mother, maternal aunt and maternal great aunt Exercise?  Yes - Yoga and Pilates Smoking?  No - quit 5 to 6 years ago Alcohol?  Yes - 4-5 drinks per week    Calcium Assessment Calcium Intake  # of servings/day  Calcium mg  Milk (8 oz) - almond 1 X 450mg  450mg   Yogurt (4 oz) 0 x  200 = 0  Cheese (1 oz) 0 x  200 = 0  Other Calcium sources   250mg   Ca supplement 0 = 0   Estimated calcium intake per day 700mg     DEXA Results Date of Test T-Score for AP Spine L1-L4 T-Score for Total Left Hip T-Score for Total Right Hip  08/13/2014 0.1 -1.0 -1.1                  FRAX 10 year estimate: Total FX risk:  3.3%  (consider medication if >/= 20%) Hip FX risk:  0.2%  (consider medication if >/= 3%)  Assessment: Osteopenia with low fracture risk  Recommendations: 1.  Discussed BMD results and fracture risk.   Patient thinks that there results are better than [facility].  [facility] 2.  recommend calcium 1200mg  daily through supplementation or diet.  3.  continue weight bearing exercise - 30 minutes at least 4 days per week.   4.  Counseled and educated about fall risk and prevention.  Recheck DEXA:  2 years  Time spent counseling patient:  30 minutes   April Herrera, PharmD, CPP

## 2014-10-08 ENCOUNTER — Encounter: Payer: Self-pay | Admitting: Family Medicine

## 2014-10-08 ENCOUNTER — Ambulatory Visit (INDEPENDENT_AMBULATORY_CARE_PROVIDER_SITE_OTHER): Payer: BC Managed Care – PPO | Admitting: Family Medicine

## 2014-10-08 ENCOUNTER — Encounter (INDEPENDENT_AMBULATORY_CARE_PROVIDER_SITE_OTHER): Payer: Self-pay

## 2014-10-08 VITALS — BP 125/76 | HR 68 | Temp 98.1°F | Ht 65.0 in | Wt 127.0 lb

## 2014-10-08 DIAGNOSIS — L298 Other pruritus: Secondary | ICD-10-CM

## 2014-10-08 DIAGNOSIS — N898 Other specified noninflammatory disorders of vagina: Secondary | ICD-10-CM

## 2014-10-08 LAB — POCT WET PREP WITH KOH
Clue Cells Wet Prep HPF POC: NEGATIVE
KOH Prep POC: NEGATIVE
Trichomonas, UA: NEGATIVE
Yeast Wet Prep HPF POC: NEGATIVE

## 2014-10-08 MED ORDER — METRONIDAZOLE 500 MG PO TABS: 500.0000 mg | ORAL_TABLET | Freq: Three times a day (TID) | ORAL | Status: DC

## 2014-10-08 NOTE — Progress Notes (Signed)
   Subjective:    Patient ID: April Herrera, female    DOB: 05/19/1966, [age] y.o.   MRN: [id number]  HPI Patient is here for c/o vaginal discharge and vaginal pruritis.  Review of Systems  Constitutional: Negative for fever.  HENT: Negative for ear pain.   Eyes: Negative for discharge.  Respiratory: Negative for cough.   Cardiovascular: Negative for chest pain.  Gastrointestinal: Negative for abdominal distention.  Endocrine: Negative for polyuria.  Genitourinary: Negative for difficulty urinating.  Musculoskeletal: Negative for gait problem and neck pain.  Skin: Negative for color change and rash.  Neurological: Negative for speech difficulty and headaches.  Psychiatric/Behavioral: Negative for agitation.       Objective:    BP 125/76 mmHg  Pulse 68  Temp(Src) 98.1 F (36.7 C) (Oral)  Ht 5\' 5"  (1.651 m)  Wt 127 lb (57.607 kg)  BMI 21.13 kg/m2  LMP 01/27/2012 Physical Exam  Constitutional: She is oriented to person, place, and time. She appears well-developed and well-nourished.  HENT:  Head: Normocephalic and atraumatic.  Mouth/Throat: Oropharynx is clear and moist.  Eyes: Pupils are equal, round, and reactive to light.  Neck: Normal range of motion. Neck supple.  Cardiovascular: Normal rate and regular rhythm.   No murmur heard. Pulmonary/Chest: Effort normal and breath sounds normal.  Abdominal: Soft. Bowel sounds are normal. There is no tenderness.  Neurological: She is alert and oriented to person, place, and time.  Skin: Skin is warm and dry.  Psychiatric: She has a normal mood and affect.          Assessment & Plan:     ICD-9-CM ICD-10-CM   1. Vaginal itching 698.1 L29.8 POCT Wet Prep with KOH     No Follow-up on file.  Deatra CanterWilliam J Gurneet Matarese FNP

## 2014-12-10 ENCOUNTER — Telehealth: Payer: Self-pay | Admitting: Family Medicine

## 2014-12-10 NOTE — Telephone Encounter (Signed)
[facility] and her thyroid panel is off, pt advised to bring the results in and to be seen by a provider for a regular follow up since pt hasn't been seen since 03/2014. Pt given appt with Dr.Stacks 3/28 at 3:25.

## 2014-12-15 ENCOUNTER — Ambulatory Visit (INDEPENDENT_AMBULATORY_CARE_PROVIDER_SITE_OTHER): Payer: BC Managed Care – PPO | Admitting: Family Medicine

## 2014-12-15 ENCOUNTER — Encounter: Payer: Self-pay | Admitting: Family Medicine

## 2014-12-15 VITALS — BP 108/69 | HR 68 | Temp 98.6°F | Ht 65.0 in | Wt 124.6 lb

## 2014-12-15 DIAGNOSIS — E038 Other specified hypothyroidism: Secondary | ICD-10-CM

## 2014-12-15 MED ORDER — LEVOTHYROXINE SODIUM 100 MCG PO TABS: 100.0000 ug | ORAL_TABLET | Freq: Every day | ORAL | Status: DC

## 2014-12-15 NOTE — Progress Notes (Signed)
Subjective:  Patient ID: April Herrera, female    DOB: 10/22/1965  Age: 49 y.o. MRN: [id number]  CC: Hypothyroidism   HPI April Herrera presents for Patient presents for follow-up on  thyroid. She has a history of hypothyroidism for many years. It has been stable recently. Pt. denies any change in  voice, loss of hair, heat or cold intolerance. Energy level has been adequate to good. She has felt rather anxious and jittery. She is concerned that her dose is too high. She denies constipation and diarrhea. No myxedema. Medication is as noted below. Verified that pt is taking it daily on an empty stomach. Unfortunately she has not. Well tolerated.  History April Herrera has a past medical history of Hypotension; Thyroid disease; and Osteopenia.   She has no past surgical history on file.   Her family history includes Cancer in her father and mother.She reports that she quit smoking about 7 years ago. Her smoking use included Cigarettes. She has never used smokeless tobacco. She reports that she drinks about 1.8 oz of alcohol per week. She reports that she does not use illicit drugs.  No current outpatient prescriptions on file prior to visit.   Current Facility-Administered Medications on File Prior to Visit  Medication Dose Route Frequency Provider Last Rate Last Dose  . methylPREDNISolone acetate (DEPO-MEDROL) injection 80 mg  80 mg Intramuscular Once Floydene Flock, MD        ROS Review of Systems  Constitutional: Negative for fever, chills, diaphoresis, appetite change and fatigue.  HENT: Negative for congestion, ear pain, hearing loss, postnasal drip, rhinorrhea, sore throat and trouble swallowing.   Respiratory: Negative for cough, chest tightness and shortness of breath.   Cardiovascular: Negative for chest pain and palpitations.  Gastrointestinal: Negative for abdominal pain.  Musculoskeletal: Negative for arthralgias.  Skin: Negative for rash.    Objective:  BP 108/69  mmHg  Pulse 68  Temp(Src) 98.6 F (37 C) (Oral)  Ht  (1.651 m)  Wt 124 lb 9.6 oz (56.518 kg)  BMI 20.73 kg/m2  LMP 01/27/2012  BP Readings from Last 3 Encounters:  12/15/14 108/69  10/08/14 125/76  04/01/14 120/76    Wt Readings from Last 3 Encounters:  12/15/14 124 lb 9.6 oz (56.518 kg)  10/08/14 127 lb (57.607 kg)  08/13/14 120 lb (54.432 kg)     Physical Exam  Constitutional: She is oriented to person, place, and time. She appears well-developed and well-nourished. No distress.  HENT:  Head: Normocephalic and atraumatic.  Right Ear: External ear normal.  Left Ear: External ear normal.  Nose: Nose normal.  Mouth/Throat: Oropharynx is clear and moist.  Eyes: Conjunctivae and EOM are normal. Pupils are equal, round, and reactive to light.  Neck: Normal range of motion. Neck supple. No thyromegaly present.  Cardiovascular: Normal rate, regular rhythm and normal heart sounds.   No murmur heard. Pulmonary/Chest: Effort normal and breath sounds normal. No respiratory distress. She has no wheezes. She has no rales.  Abdominal: Soft. Bowel sounds are normal. She exhibits no distension. There is no tenderness.  Lymphadenopathy:    She has no cervical adenopathy.  Neurological: She is alert and oriented to person, place, and time. She has normal reflexes.  Skin: Skin is warm and dry.  Psychiatric: She has a normal mood and affect. Her behavior is normal. Judgment and thought content normal.    No results found for: HGBA1C  Lab Results  Component Value Date   CHOL 160  04/01/2014   TRIG 102 04/01/2014   HDL 64 04/01/2014   LDLCALC 76 04/01/2014   TSH 10.710* 07/01/2014    Mm Digital Screening Bilateral  03/28/2014   CLINICAL DATA:  Screening.  EXAM: DIGITAL SCREENING BILATERAL MAMMOGRAM WITH CAD  COMPARISON:  Previous exam(s).  ACR Breast Density Category c: The breast tissue is heterogeneously dense, which may obscure small masses.  FINDINGS: There are no findings  suspicious for malignancy. Images were processed with CAD.  IMPRESSION: No mammographic evidence of malignancy. A result letter of this screening mammogram will be mailed directly to the patient.  RECOMMENDATION: Screening mammogram in one year. (Code:SM-B-01Y)  BI-RADS CATEGORY  1: Negative.   Electronically Signed   By: Amie Portlandavid  Ormond M.D.   On: 03/28/2014 15:07    Assessment & Plan:   April Herrera was seen today for hypothyroidism.  Diagnoses and all orders for this visit:  Other specified hypothyroidism Orders: -     Thyroid Panel With TSH -     levothyroxine (SYNTHROID, LEVOTHROID) 100 MCG tablet; Take 1 tablet (100 mcg total) by mouth daily.   I have discontinued April Herrera's norethindrone-ethinyl estradiol 1/35 and metroNIDAZOLE. I have also changed her levothyroxine. We will continue to administer methylPREDNISolone acetate.  Meds ordered this encounter  Medications  . levothyroxine (SYNTHROID, LEVOTHROID) 100 MCG tablet    Sig: Take 1 tablet (100 mcg total) by mouth daily.    Dispense:  90 tablet    Refill:  3     Follow-up: Return in about 2 months (around 02/14/2015).  Mechele ClaudeWarren Yazmina Pareja, M.D.

## 2014-12-15 NOTE — Patient Instructions (Signed)
Do take her thyroid medication on an empty stomach. This implies nothing to eat or drink other than water for 2 hours prior to taking the medicine and for another hour after taking the medicine.  The Nettie pot is great for sinus drainage. You can supplement with over-the-counter Allegra 180 mg once or twice daily as needed when allergy season is severe. Over-the-counter Flonase is also excellent for helping relieve the swelling of your congestion.

## 2015-02-20 ENCOUNTER — Ambulatory Visit: Payer: BC Managed Care – PPO | Admitting: Family Medicine

## 2015-02-27 ENCOUNTER — Encounter: Payer: Self-pay | Admitting: Family Medicine

## 2015-02-27 ENCOUNTER — Ambulatory Visit (INDEPENDENT_AMBULATORY_CARE_PROVIDER_SITE_OTHER): Payer: BC Managed Care – PPO | Admitting: Family Medicine

## 2015-02-27 ENCOUNTER — Encounter (INDEPENDENT_AMBULATORY_CARE_PROVIDER_SITE_OTHER): Payer: Self-pay

## 2015-02-27 VITALS — BP 141/84 | HR 73 | Temp 99.3°F | Ht 65.0 in | Wt 125.0 lb

## 2015-02-27 DIAGNOSIS — J0100 Acute maxillary sinusitis, unspecified: Secondary | ICD-10-CM | POA: Diagnosis not present

## 2015-02-27 MED ORDER — AMOXICILLIN-POT CLAVULANATE 875-125 MG PO TABS: 1.0000 | ORAL_TABLET | Freq: Two times a day (BID) | ORAL | Status: DC

## 2015-02-27 MED ORDER — BETAMETHASONE SOD PHOS & ACET 6 (3-3) MG/ML IJ SUSP
6.0000 mg | Freq: Once | INTRAMUSCULAR | Status: AC
  Administered 2015-02-27: 6 mg via INTRAMUSCULAR

## 2015-03-01 NOTE — Progress Notes (Signed)
Subjective:  Patient ID: April Herrera, female    DOB: 03-Feb-1966  Age: 49 y.o. MRN: [id number]  CC: Sinusitis   HPI April Herrera presents for Symptoms include congestion, facial pain, nasal congestion, no  fever, non productive cough, post nasal drip and sinus pressure with no fever, chills, night sweats or weight loss. Onset of symptoms was a few days ago, gradually worsening since that time. Pt.is drinking moderate amounts of fluids.     History April Herrera has a past medical history of Hypotension; Thyroid disease; and Osteopenia.   She has no past surgical history on file.   Her family history includes Cancer in her father and mother.She reports that she quit smoking about 7 years ago. Her smoking use included Cigarettes. She has never used smokeless tobacco. She reports that she drinks about 1.8 oz of alcohol per week. She reports that she does not use illicit drugs.  Current Outpatient Prescriptions on File Prior to Visit  Medication Sig Dispense Refill  . levothyroxine (SYNTHROID, LEVOTHROID) 100 MCG tablet Take 1 tablet (100 mcg total) by mouth daily. 90 tablet 3   No current facility-administered medications on file prior to visit.    ROS Review of Systems  Constitutional: Negative for fever, chills, activity change and appetite change.  HENT: Positive for congestion, postnasal drip, rhinorrhea and sinus pressure. Negative for ear discharge, ear pain, hearing loss, nosebleeds, sneezing and trouble swallowing.   Respiratory: Negative for chest tightness and shortness of breath.   Cardiovascular: Negative for chest pain and palpitations.  Skin: Negative for rash.    Objective:  BP 141/84 mmHg  Pulse 73  Temp(Src) 99.3 F (37.4 C) (Oral)  Ht  (1.651 m)  Wt 125 lb (56.7 kg)  BMI 20.80 kg/m2  LMP 01/27/2012  Physical Exam  Constitutional: She appears well-developed and well-nourished.  HENT:  Head: Normocephalic and atraumatic.  Right Ear: Tympanic  membrane and external ear normal. No decreased hearing is noted.  Left Ear: Tympanic membrane and external ear normal. No decreased hearing is noted.  Nose: Mucosal edema present. Right sinus exhibits no frontal sinus tenderness. Left sinus exhibits no frontal sinus tenderness.  Mouth/Throat: No oropharyngeal exudate or posterior oropharyngeal erythema.  Neck: No Brudzinski's sign noted.  Pulmonary/Chest: Breath sounds normal. No respiratory distress.  Lymphadenopathy:       Head (right side): No preauricular adenopathy present.       Head (left side): No preauricular adenopathy present.       Right cervical: No superficial cervical adenopathy present.      Left cervical: No superficial cervical adenopathy present.    Assessment & Plan:   April Herrera was seen today for sinusitis.  Diagnoses and all orders for this visit:  Acute maxillary sinusitis, recurrence not specified Orders: -     betamethasone acetate-betamethasone sodium phosphate (CELESTONE) injection 6 mg; Inject 1 mL (6 mg total) into the muscle once.  Other orders -     amoxicillin-clavulanate (AUGMENTIN) 875-125 MG per tablet; Take 1 tablet by mouth 2 (two) times daily. Take all of this medication   I am having April Herrera start on amoxicillin-clavulanate. I am also having her maintain her levothyroxine. We will stop administering methylPREDNISolone acetate. Additionally, we administered betamethasone acetate-betamethasone sodium phosphate.  Meds ordered this encounter  Medications  . amoxicillin-clavulanate (AUGMENTIN) 875-125 MG per tablet    Sig: Take 1 tablet by mouth 2 (two) times daily. Take all of this medication    Dispense:  20 tablet  Refill:  0  . betamethasone acetate-betamethasone sodium phosphate (CELESTONE) injection 6 mg    Sig:      Follow-up: No Follow-up on file.  April Herrera, M.D.

## 2015-03-06 ENCOUNTER — Other Ambulatory Visit (INDEPENDENT_AMBULATORY_CARE_PROVIDER_SITE_OTHER): Payer: BC Managed Care – PPO

## 2015-03-06 ENCOUNTER — Other Ambulatory Visit: Payer: Self-pay | Admitting: Family Medicine

## 2015-03-06 DIAGNOSIS — E038 Other specified hypothyroidism: Secondary | ICD-10-CM

## 2015-03-06 NOTE — Progress Notes (Signed)
Lab only thyroid panel ordered back on 12-15-14

## 2015-03-07 LAB — THYROID PANEL WITH TSH
Free Thyroxine Index: 3.1 (ref 1.2–4.9)
T3 Uptake Ratio: 31 % (ref 24–39)
T4 TOTAL: 10.1 ug/dL (ref 4.5–12.0)
TSH: 0.056 u[IU]/mL — ABNORMAL LOW (ref 0.450–4.500)

## 2015-03-07 LAB — PLEASE NOTE

## 2015-03-11 MED ORDER — LEVOTHYROXINE SODIUM 100 MCG PO TABS: 100.0000 ug | ORAL_TABLET | Freq: Every day | ORAL | Status: DC

## 2015-03-11 NOTE — Addendum Note (Signed)
Addended by: Tamera Punt on: 03/11/2015 10:03 AM   Modules accepted: Orders

## 2015-03-13 ENCOUNTER — Ambulatory Visit: Payer: BC Managed Care – PPO | Admitting: Family Medicine

## 2015-03-17 ENCOUNTER — Other Ambulatory Visit: Payer: Self-pay

## 2015-03-17 DIAGNOSIS — Z1231 Encounter for screening mammogram for malignant neoplasm of breast: Secondary | ICD-10-CM

## 2015-03-19 ENCOUNTER — Telehealth: Payer: Self-pay | Admitting: Family Medicine

## 2015-03-19 NOTE — Telephone Encounter (Signed)
TC back to pt - she thinks her thyroid dosage should be adjusted d/t having troubles sleeping even with melatonin, hard time relaxing and having palpitations.

## 2015-03-19 NOTE — Telephone Encounter (Signed)
Pt aware she NTBS since she is having symptoms and cancelled her 6/24 visit since she was not have sxs at that time

## 2015-03-19 NOTE — Telephone Encounter (Signed)
I will be happy to see her for that. Please arrange a time

## 2015-03-19 NOTE — Telephone Encounter (Signed)
Exactly. Since she is having problems she needs to be seen. She was told that if she was not having problems she did not need to be seen. She canceled an appointment stating that she did not have any problems just a few days ago. Since she is having problems she needs to be seen.

## 2015-03-24 ENCOUNTER — Ambulatory Visit (INDEPENDENT_AMBULATORY_CARE_PROVIDER_SITE_OTHER): Payer: BC Managed Care – PPO | Admitting: Family Medicine

## 2015-03-24 ENCOUNTER — Encounter: Payer: Self-pay | Admitting: Family Medicine

## 2015-03-24 VITALS — BP 108/64 | HR 77 | Temp 98.3°F | Ht 65.0 in | Wt 124.6 lb

## 2015-03-24 DIAGNOSIS — Z23 Encounter for immunization: Secondary | ICD-10-CM

## 2015-03-24 DIAGNOSIS — E038 Other specified hypothyroidism: Secondary | ICD-10-CM

## 2015-03-24 MED ORDER — LEVOTHYROXINE SODIUM 88 MCG PO TABS: 88.0000 ug | ORAL_TABLET | Freq: Every day | ORAL | Status: DC

## 2015-03-24 NOTE — Patient Instructions (Signed)

## 2015-03-24 NOTE — Progress Notes (Signed)
Subjective:  Patient ID: April Herrera, female    DOB: 1966-07-16  Age: 49 y.o. MRN: [id number]  CC: Hypothyroidism   HPI MASIYA Herrera presents for Patient presents for follow-up on  thyroid. She has a history of hypothyroidism for many years. It has been stable recently. Pt. denies any change in  voice, loss of hair, heat or cold intolerance. Energy level has been excessive - "energizer bunny.". She denies constipation and diarrhea. No myxedema. Medication is as noted below. Verified that pt is taking it daily on an empty stomach at 2 AM. Well tolerated. Having palpitations, can't sleep past 2 AM and anxious. Onset2-3 months.  History Shane has a past medical history of Hypotension; Thyroid disease; and Osteopenia.   She has no past surgical history on file.   Her family history includes Cancer in her father and mother.She reports that she quit smoking about 7 years ago. Her smoking use included Cigarettes. She has never used smokeless tobacco. She reports that she drinks about 1.8 oz of alcohol per week. She reports that she does not use illicit drugs.  Outpatient Prescriptions Prior to Visit  Medication Sig Dispense Refill  . levothyroxine (SYNTHROID, LEVOTHROID) 100 MCG tablet Take 1 tablet (100 mcg total) by mouth daily. 90 tablet 1  . amoxicillin-clavulanate (AUGMENTIN) 875-125 MG per tablet Take 1 tablet by mouth 2 (two) times daily. Take all of this medication (Patient not taking: Reported on 03/24/2015) 20 tablet 0   No facility-administered medications prior to visit.    ROS Review of Systems  Constitutional: Negative for fever, chills, diaphoresis, appetite change, fatigue and unexpected weight change.  HENT: Negative for congestion, ear pain, hearing loss, postnasal drip, rhinorrhea, sneezing, sore throat and trouble swallowing.   Eyes: Negative for pain.  Respiratory: Negative for cough, chest tightness and shortness of breath.   Cardiovascular: Negative for  chest pain and palpitations.  Gastrointestinal: Negative for nausea, vomiting, abdominal pain, diarrhea and constipation.  Endocrine: Negative for cold intolerance and heat intolerance.       See history of present illness  Genitourinary: Negative for dysuria, frequency and menstrual problem.  Musculoskeletal: Negative for joint swelling and arthralgias.  Skin: Negative for rash.  Neurological: Negative for dizziness, weakness, numbness and headaches.  Psychiatric/Behavioral: Negative for dysphoric mood and agitation.    Objective:  BP 108/64 mmHg  Pulse 77  Temp(Src) 98.3 F (36.8 C) (Oral)  Ht  (1.651 m)  Wt 124 lb 9.6 oz (56.518 kg)  BMI 20.73 kg/m2  LMP 01/27/2012  BP Readings from Last 3 Encounters:  03/24/15 108/64  02/27/15 141/84  12/15/14 108/69    Wt Readings from Last 3 Encounters:  03/24/15 124 lb 9.6 oz (56.518 kg)  02/27/15 125 lb (56.7 kg)  12/15/14 124 lb 9.6 oz (56.518 kg)     Physical Exam  Constitutional: She is oriented to person, place, and time. She appears well-developed and well-nourished. No distress.  HENT:  Head: Normocephalic and atraumatic.  Right Ear: External ear normal.  Left Ear: External ear normal.  Nose: Nose normal.  Mouth/Throat: Oropharynx is clear and moist.  Eyes: Conjunctivae and EOM are normal. Pupils are equal, round, and reactive to light.  Neck: Normal range of motion. Neck supple. No thyromegaly present.  Cardiovascular: Normal rate, regular rhythm and normal heart sounds.   No murmur heard. Pulmonary/Chest: Effort normal and breath sounds normal. No respiratory distress. She has no wheezes. She has no rales.  Abdominal: Soft. Bowel sounds are  normal. She exhibits no distension. There is no tenderness.  Lymphadenopathy:    She has no cervical adenopathy.  Neurological: She is alert and oriented to person, place, and time. She has normal reflexes.  Skin: Skin is warm and dry.  Psychiatric: She has a normal mood and  affect. Her behavior is normal. Judgment and thought content normal.    No results found for: HGBA1C  Lab Results  Component Value Date   CHOL 160 04/01/2014   TRIG 102 04/01/2014   HDL 64 04/01/2014   LDLCALC 76 04/01/2014   TSH 0.056* 03/06/2015    Mm Digital Screening Bilateral  03/28/2014   CLINICAL DATA:  Screening.  EXAM: DIGITAL SCREENING BILATERAL MAMMOGRAM WITH CAD  COMPARISON:  Previous exam(s).  ACR Breast Density Category c: The breast tissue is heterogeneously dense, which may obscure small masses.  FINDINGS: There are no findings suspicious for malignancy. Images were processed with CAD.  IMPRESSION: No mammographic evidence of malignancy. A result letter of this screening mammogram will be mailed directly to the patient.  RECOMMENDATION: Screening mammogram in one year. (Code:SM-B-01Y)  BI-RADS CATEGORY  1: Negative.   Electronically Signed   By: Amie Portlandavid  Ormond M.D.   On: 03/28/2014 15:07    Assessment & Plan:   April Herrera was seen today for hypothyroidism.  Diagnoses and all orders for this visit:  Other specified hypothyroidism Orders: -     levothyroxine (SYNTHROID, LEVOTHROID) 88 MCG tablet; Take 1 tablet (88 mcg total) by mouth daily before breakfast. -     TSH; Future -     T4, free; Future  Other orders -     Tdap vaccine greater than or equal to 7yo IM   I have discontinued Ms. Kau's amoxicillin-clavulanate. I have also changed her levothyroxine.  Meds ordered this encounter  Medications  . levothyroxine (SYNTHROID, LEVOTHROID) 88 MCG tablet    Sig: Take 1 tablet (88 mcg total) by mouth daily before breakfast.    Dispense:  90 tablet    Refill:  1     Follow-up: Return in about 6 months (around 09/24/2015).  Mechele ClaudeWarren Kindred Heying, M.D.

## 2015-04-02 ENCOUNTER — Ambulatory Visit: Payer: BC Managed Care – PPO

## 2015-04-14 ENCOUNTER — Ambulatory Visit
Admission: RE | Admit: 2015-04-14 | Discharge: 2015-04-14 | Disposition: A | Discharge status: Home / Self Care | Payer: BC Managed Care – PPO | Source: Ambulatory Visit

## 2015-04-14 DIAGNOSIS — Z1231 Encounter for screening mammogram for malignant neoplasm of breast: Secondary | ICD-10-CM

## 2015-06-27 ENCOUNTER — Encounter: Payer: Self-pay | Admitting: Family Medicine

## 2015-06-27 ENCOUNTER — Ambulatory Visit (INDEPENDENT_AMBULATORY_CARE_PROVIDER_SITE_OTHER): Payer: BC Managed Care – PPO | Admitting: Family Medicine

## 2015-06-27 VITALS — BP 133/82 | HR 59 | Temp 98.7°F | Ht 65.0 in | Wt 120.6 lb

## 2015-06-27 DIAGNOSIS — J0101 Acute recurrent maxillary sinusitis: Secondary | ICD-10-CM

## 2015-06-27 MED ORDER — AMOXICILLIN-POT CLAVULANATE 875-125 MG PO TABS: 1.0000 | ORAL_TABLET | Freq: Two times a day (BID) | ORAL | Status: DC

## 2015-06-27 NOTE — Progress Notes (Signed)
   HPI  Patient presents today for concern of sinus infection.  She explains that she's had about one month symptoms of nasal congestion, sinus pressure mainly in the maxillary sinuses, sore throat, and nail developing ear pain bilaterally.  She denies fever, chills, difficulty breathing, or loss of appetite.  She has sinus issues in general using a Nettie pot daily, she states that since her symptoms have worsened and her discharge seems to become more purulent she came in for evaluation.  PMH: Smoking status noted ROS: Per HPI  Objective: BP 133/82 mmHg  Pulse 59  Temp(Src) 98.7 F (37.1 C) (Oral)  Ht  (1.651 m)  Wt 120 lb 9.6 oz (54.704 kg)  BMI 20.07 kg/m2  LMP 01/27/2012 Gen: NAD, alert, cooperative with exam HEENT: NCAT, TMs normal bilaterally, nares clear, oropharynx clear She asked that I do not palpate her sinuses, I understand this CV: RRR, good S1/S2, no murmur Resp: CTABL, no wheezes, non-labored Abd: SNTND, BS present, no guarding or organomegaly Ext: No edema, warm Neuro: Alert and oriented, No gross deficits  Assessment and plan:  # Acute maxillary sinusitis Treat with Augmentin Discussed possible steroid, she would like to avoid this Given handout, continue neti pot   Meds ordered this encounter  Medications  . Fish Oil-Cholecalciferol (FISH OIL + D3 PO)    Sig: Take by mouth.  . Calcium Citrate-Vitamin D (CALCIUM + D PO)    Sig: Take by mouth.  . Multiple Vitamins-Minerals (MULTI COMPLETE PO)    Sig: Take by mouth.  Marland Kitchen amoxicillin-clavulanate (AUGMENTIN) 875-125 MG tablet    Sig: Take 1 tablet by mouth 2 (two) times daily.    Dispense:  20 tablet    Refill:  0    Murtis Sink, MD [facility] 06/27/2015, 9:47 AM

## 2015-06-27 NOTE — Patient Instructions (Signed)

## 2015-07-16 ENCOUNTER — Ambulatory Visit (INDEPENDENT_AMBULATORY_CARE_PROVIDER_SITE_OTHER): Payer: BC Managed Care – PPO | Admitting: Family Medicine

## 2015-07-16 ENCOUNTER — Encounter: Payer: Self-pay | Admitting: Family Medicine

## 2015-07-16 VITALS — BP 126/84 | HR 84 | Temp 98.5°F | Ht 65.0 in | Wt 122.0 lb

## 2015-07-16 DIAGNOSIS — J189 Pneumonia, unspecified organism: Secondary | ICD-10-CM | POA: Diagnosis not present

## 2015-07-16 MED ORDER — AZITHROMYCIN 250 MG PO TABS: ORAL_TABLET | ORAL | Status: DC

## 2015-07-16 MED ORDER — BENZONATATE 100 MG PO CAPS: 100.0000 mg | ORAL_CAPSULE | Freq: Two times a day (BID) | ORAL | Status: DC | PRN

## 2015-07-16 NOTE — Patient Instructions (Signed)
Great to see you!  Try to rest and get plenty of fluids  Come back if you worsen or do not improve as expected (within 5 days you should be turning the corner)

## 2015-07-16 NOTE — Progress Notes (Signed)
   HPI  Patient presents today with cough and congestion.  Patient spends that she's been sick for one week with cough, nasal congestion, sore throat. She's developed anterior chest pain with cough and deep inspiration. She works as a first grade teacher [place] She was recently treated with AugmentAnimal nutritionistin for sinus infection. She completely resolved after finishing the antibiotics and felt well for a couple weeks before this began. She has mild dyspnea, no increased work of breathing.  She denies loss of appetite or oral intolerance.  PMH: Smoking status noted ROS: Per HPI  Objective: BP 126/84 mmHg  Pulse 84  Temp(Src) 98.5 F (36.9 C) (Oral)  Ht 5\' 5"  (1.651 m)  Wt 122 lb (55.339 kg)  BMI 20.30 kg/m2  LMP 01/27/2012 Gen: NAD, alert, cooperative with exam HEENT: NCAT, nares clear, TMs normal, oropharynx clear CV: RRR, good S1/S2, no murmur Resp: CTABL, no wheezes, non-labored Ext: No edema, warm Neuro: Alert and oriented, No gross deficits  Assessment and plan:  # Atypical pneumonia Treat as pneumonia given time course and pleurisy Azithromycin Tessalon for cough She has guaifenesin with codeine at home which I encouraged her to try at night.    Meds ordered this encounter  Medications  . azithromycin (ZITHROMAX) 250 MG tablet    Sig: Take 2 tablets on day 1 and 1 tablet daily after that    Dispense:  6 tablet    Refill:  0  . benzonatate (TESSALON) 100 MG capsule    Sig: Take 1 capsule (100 mg total) by mouth 2 (two) times daily as needed for cough.    Dispense:  20 capsule    Refill:  0    Murtis SinkSam Iyanla Eilers, MD [facility] 07/16/2015, 5:18 PM

## 2015-08-18 ENCOUNTER — Ambulatory Visit (INDEPENDENT_AMBULATORY_CARE_PROVIDER_SITE_OTHER): Payer: Worker's Compensation | Admitting: Nurse Practitioner

## 2015-08-18 ENCOUNTER — Encounter: Payer: Self-pay | Admitting: Nurse Practitioner

## 2015-08-18 VITALS — BP 110/67 | HR 70 | Temp 98.5°F | Ht 65.0 in | Wt 123.0 lb

## 2015-08-18 DIAGNOSIS — M25562 Pain in left knee: Secondary | ICD-10-CM | POA: Diagnosis not present

## 2015-08-18 DIAGNOSIS — S39012A Strain of muscle, fascia and tendon of lower back, initial encounter: Secondary | ICD-10-CM | POA: Diagnosis not present

## 2015-08-18 MED ORDER — CYCLOBENZAPRINE HCL 10 MG PO TABS: 10.0000 mg | ORAL_TABLET | Freq: Three times a day (TID) | ORAL | Status: DC | PRN

## 2015-08-18 MED ORDER — NAPROXEN 500 MG PO TABS: 500.0000 mg | ORAL_TABLET | Freq: Two times a day (BID) | ORAL | Status: DC

## 2015-08-18 NOTE — Progress Notes (Signed)
   Subjective:    Patient ID: April Herrera, female    DOB: 03/19/1966, 49 y.o.   MRN: [id number]  DATE OF INJURY 08/17/15   HPI Patient fell at work yesterday- she slipped up in a puddle of water- she landed on her right knee and both hands, and in the process she twisted her left knee. This morning her left knee and lower back are all that hurt. Rates pain 5-6/10. Sitting increases pain in lower back and knee. STretching helps.    Review of Systems  Constitutional: Negative.   HENT: Negative.   Respiratory: Negative.   Cardiovascular: Negative.   Genitourinary: Negative.   Neurological: Negative.   Psychiatric/Behavioral: Negative.   All other systems reviewed and are negative.      Objective:   Physical Exam  Constitutional: She is oriented to person, place, and time. She appears well-developed and well-nourished.  Cardiovascular: Normal rate, regular rhythm and normal heart sounds.   Musculoskeletal:  FROM of lumbar spine with pain on flexion. (-) SLR bil Motor strength and sensation distally intact FROM of left knee without pain- no effusion and no atella tenderness  Neurological: She is alert and oriented to person, place, and time.  Skin: Skin is warm and dry.  Psychiatric: She has a normal mood and affect. Her behavior is normal. Judgment and thought content normal.    BP 110/67 mmHg  Pulse 70  Temp(Src) 98.5 F (36.9 C) (Oral)  Ht 5\' 5"  (1.651 m)  Wt 123 lb (55.792 kg)  BMI 20.47 kg/m2  LMP 01/27/2012       Assessment & Plan:   1. Low back strain, initial encounter   2. Left knee pain    Meds ordered this encounter  Medications  . naproxen (NAPROSYN) 500 MG tablet    Sig: Take 1 tablet (500 mg total) by mouth 2 (two) times daily with a meal.    Dispense:  60 tablet    Refill:  1    Order Specific Question:  Supervising Provider    Answer:  Ernestina PennaMOORE, DONALD W [[id number]]  . cyclobenzaprine (FLEXERIL) 10 MG tablet    Sig: Take 1 tablet (10 mg total) by  mouth 3 (three) times daily as needed for muscle spasms.    Dispense:  30 tablet    Refill:  0    Order Specific Question:  Supervising Provider    Answer:  Deborra MedinaMOORE, DONALD W [[id number]]  no work restrictions Moist heat Stretches RTO prn  Mary-Margaret Daphine DeutscherMartin, FNP

## 2015-08-18 NOTE — Patient Instructions (Signed)
Low Back Sprain With Rehab A sprain is an injury in which a ligament is torn. The ligaments of the lower back are vulnerable to sprains. However, they are strong and require great force to be injured. These ligaments are important for stabilizing the spinal column. Sprains are classified into three categories. Grade 1 sprains cause pain, but the tendon is not lengthened. Grade 2 sprains include a lengthened ligament, due to the ligament being stretched or partially ruptured. With grade 2 sprains there is still function, although the function may be decreased. Grade 3 sprains involve a complete tear of the tendon or muscle, and function is usually impaired. SYMPTOMS   Severe pain in the lower back.  Sometimes, a feeling of a "pop," "snap," or tear, at the time of injury.  Tenderness and sometimes swelling at the injury site.  Uncommonly, bruising (contusion) within 48 hours of injury.  Muscle spasms in the back. CAUSES  Low back sprains occur when a force is placed on the ligaments that is greater than they can handle. Common causes of injury include:  Performing a stressful act while off-balance.  Repetitive stressful activities that involve movement of the lower back.  Direct hit (trauma) to the lower back. RISK INCREASES WITH:  Contact sports (football, wrestling).  Collisions (major skiing accidents).  Sports that require throwing or lifting (baseball, weightlifting).  Sports involving twisting of the spine (gymnastics, diving, tennis, golf).  Poor strength and flexibility.  Inadequate protection.  Previous back injury or surgery (especially fusion). PREVENTION  Wear properly fitted and padded protective equipment.  Warm up and stretch properly before activity.  Allow for adequate recovery between workouts.  Maintain physical fitness:  Strength, flexibility, and endurance.  Cardiovascular fitness.  Maintain a healthy body weight. PROGNOSIS  If treated properly,  low back sprains usually heal with non-surgical treatment. The length of time for healing depends on the severity of the injury.  RELATED COMPLICATIONS   Recurring symptoms, resulting in a chronic problem.  Chronic inflammation and pain in the low back.  Delayed healing or resolution of symptoms, especially if activity is resumed too soon.  Prolonged impairment.  Unstable or arthritic joints of the low back. TREATMENT  Treatment first involves the use of ice and medicine, to reduce pain and inflammation. The use of strengthening and stretching exercises may help reduce pain with activity. These exercises may be performed at home or with a therapist. Severe injuries may require referral to a therapist for further evaluation and treatment, such as ultrasound. Your caregiver may advise that you wear a back brace or corset, to help reduce pain and discomfort. Often, prolonged bed rest results in greater harm then benefit. Corticosteroid injections may be recommended. However, these should be reserved for the most serious cases. It is important to avoid using your back when lifting objects. At night, sleep on your back on a firm mattress, with a pillow placed under your knees. If non-surgical treatment is unsuccessful, surgery may be needed.  MEDICATION   If pain medicine is needed, nonsteroidal anti-inflammatory medicines (aspirin and ibuprofen), or other minor pain relievers (acetaminophen), are often advised.  Do not take pain medicine for 7 days before surgery.  Prescription pain relievers may be given, if your caregiver thinks they are needed. Use only as directed and only as much as you need.  Ointments applied to the skin may be helpful.  Corticosteroid injections may be given by your caregiver. These injections should be reserved for the most serious cases, because   they may only be given a certain number of times. HEAT AND COLD  Cold treatment (icing) should be applied for 10 to 15  minutes every 2 to 3 hours for inflammation and pain, and immediately after activity that aggravates your symptoms. Use ice packs or an ice massage.  Heat treatment may be used before performing stretching and strengthening activities prescribed by your caregiver, physical therapist, or athletic trainer. Use a heat pack or a warm water soak. SEEK MEDICAL CARE IF:   Symptoms get worse or do not improve in 2 to 4 weeks, despite treatment.  You develop numbness or weakness in either leg.  You lose bowel or bladder function.  Any of the following occur after surgery: fever, increased pain, swelling, redness, drainage of fluids, or bleeding in the affected area.  New, unexplained symptoms develop. (Drugs used in treatment may produce side effects.) EXERCISES  RANGE OF MOTION (ROM) AND STRETCHING EXERCISES - Low Back Sprain Most people with lower back pain will find that their symptoms get worse with excessive bending forward (flexion) or arching at the lower back (extension). The exercises that will help resolve your symptoms will focus on the opposite motion.  Your physician, physical therapist or athletic trainer will help you determine which exercises will be most helpful to resolve your lower back pain. Do not complete any exercises without first consulting with your caregiver. Discontinue any exercises which make your symptoms worse, until you speak to your caregiver. If you have pain, numbness or tingling which travels down into your buttocks, leg or foot, the goal of the therapy is for these symptoms to move closer to your back and eventually resolve. Sometimes, these leg symptoms will get better, but your lower back pain may worsen. This is often an indication of progress in your rehabilitation. Be very alert to any changes in your symptoms and the activities in which you participated in the 24 hours prior to the change. Sharing this information with your caregiver will allow him or her to most  efficiently treat your condition. These exercises may help you when beginning to rehabilitate your injury. Your symptoms may resolve with or without further involvement from your physician, physical therapist or athletic trainer. While completing these exercises, remember:   Restoring tissue flexibility helps normal motion to return to the joints. This allows healthier, less painful movement and activity.  An effective stretch should be held for at least 30 seconds.  A stretch should never be painful. You should only feel a gentle lengthening or release in the stretched tissue. FLEXION RANGE OF MOTION AND STRETCHING EXERCISES: STRETCH - Flexion, Single Knee to Chest   Lie on a firm bed or floor with both legs extended in front of you.  Keeping one leg in contact with the floor, bring your opposite knee to your chest. Hold your leg in place by either grabbing behind your thigh or at your knee.  Pull until you feel a gentle stretch in your low back. Hold __________ seconds.  Slowly release your grasp and repeat the exercise with the opposite side. Repeat __________ times. Complete this exercise __________ times per day.  STRETCH - Flexion, Double Knee to Chest  Lie on a firm bed or floor with both legs extended in front of you.  Keeping one leg in contact with the floor, bring your opposite knee to your chest.  Tense your stomach muscles to support your back and then lift your other knee to your chest. Hold your legs in   place by either grabbing behind your thighs or at your knees.  Pull both knees toward your chest until you feel a gentle stretch in your low back. Hold __________ seconds.  Tense your stomach muscles and slowly return one leg at a time to the floor. Repeat __________ times. Complete this exercise __________ times per day.  STRETCH - Low Trunk Rotation  Lie on a firm bed or floor. Keeping your legs in front of you, bend your knees so they are both pointed toward the  ceiling and your feet are flat on the floor.  Extend your arms out to the side. This will stabilize your upper body by keeping your shoulders in contact with the floor.  Gently and slowly drop both knees together to one side until you feel a gentle stretch in your low back. Hold for __________ seconds.  Tense your stomach muscles to support your lower back as you bring your knees back to the starting position. Repeat the exercise to the other side. Repeat __________ times. Complete this exercise __________ times per day  EXTENSION RANGE OF MOTION AND FLEXIBILITY EXERCISES: STRETCH - Extension, Prone on Elbows   Lie on your stomach on the floor, a bed will be too soft. Place your palms about shoulder width apart and at the height of your head.  Place your elbows under your shoulders. If this is too painful, stack pillows under your chest.  Allow your body to relax so that your hips drop lower and make contact more completely with the floor.  Hold this position for __________ seconds.  Slowly return to lying flat on the floor. Repeat __________ times. Complete this exercise __________ times per day.  RANGE OF MOTION - Extension, Prone Press Ups  Lie on your stomach on the floor, a bed will be too soft. Place your palms about shoulder width apart and at the height of your head.  Keeping your back as relaxed as possible, slowly straighten your elbows while keeping your hips on the floor. You may adjust the placement of your hands to maximize your comfort. As you gain motion, your hands will come more underneath your shoulders.  Hold this position __________ seconds.  Slowly return to lying flat on the floor. Repeat __________ times. Complete this exercise __________ times per day.  RANGE OF MOTION- Quadruped, Neutral Spine   Assume a hands and knees position on a firm surface. Keep your hands under your shoulders and your knees under your hips. You may place padding under your knees for  comfort.  Drop your head and point your tailbone toward the ground below you. This will round out your lower back like an angry cat. Hold this position for __________ seconds.  Slowly lift your head and release your tail bone so that your back sags into a large arch, like an old horse.  Hold this position for __________ seconds.  Repeat this until you feel limber in your low back.  Now, find your "sweet spot." This will be the most comfortable position somewhere between the two previous positions. This is your neutral spine. Once you have found this position, tense your stomach muscles to support your low back.  Hold this position for __________ seconds. Repeat __________ times. Complete this exercise __________ times per day.  STRENGTHENING EXERCISES - Low Back Sprain These exercises may help you when beginning to rehabilitate your injury. These exercises should be done near your "sweet spot." This is the neutral, low-back arch, somewhere between fully rounded and   fully arched, that is your least painful position. When performed in this safe range of motion, these exercises can be used for people who have either a flexion or extension based injury. These exercises may resolve your symptoms with or without further involvement from your physician, physical therapist or athletic trainer. While completing these exercises, remember:   Muscles can gain both the endurance and the strength needed for everyday activities through controlled exercises.  Complete these exercises as instructed by your physician, physical therapist or athletic trainer. Increase the resistance and repetitions only as guided.  You may experience muscle soreness or fatigue, but the pain or discomfort you are trying to eliminate should never worsen during these exercises. If this pain does worsen, stop and make certain you are following the directions exactly. If the pain is still present after adjustments, discontinue the  exercise until you can discuss the trouble with your caregiver. STRENGTHENING - Deep Abdominals, Pelvic Tilt   Lie on a firm bed or floor. Keeping your legs in front of you, bend your knees so they are both pointed toward the ceiling and your feet are flat on the floor.  Tense your lower abdominal muscles to press your low back into the floor. This motion will rotate your pelvis so that your tail bone is scooping upwards rather than pointing at your feet or into the floor. With a gentle tension and even breathing, hold this position for __________ seconds. Repeat __________ times. Complete this exercise __________ times per day.  STRENGTHENING - Abdominals, Crunches   Lie on a firm bed or floor. Keeping your legs in front of you, bend your knees so they are both pointed toward the ceiling and your feet are flat on the floor. Cross your arms over your chest.  Slightly tip your chin down without bending your neck.  Tense your abdominals and slowly lift your trunk high enough to just clear your shoulder blades. Lifting higher can put excessive stress on the lower back and does not further strengthen your abdominal muscles.  Control your return to the starting position. Repeat __________ times. Complete this exercise __________ times per day.  STRENGTHENING - Quadruped, Opposite UE/LE Lift   Assume a hands and knees position on a firm surface. Keep your hands under your shoulders and your knees under your hips. You may place padding under your knees for comfort.  Find your neutral spine and gently tense your abdominal muscles so that you can maintain this position. Your shoulders and hips should form a rectangle that is parallel with the floor and is not twisted.  Keeping your trunk steady, lift your right hand no higher than your shoulder and then your left leg no higher than your hip. Make sure you are not holding your breath. Hold this position for __________ seconds.  Continuing to keep  your abdominal muscles tense and your back steady, slowly return to your starting position. Repeat with the opposite arm and leg. Repeat __________ times. Complete this exercise __________ times per day.  STRENGTHENING - Abdominals and Quadriceps, Straight Leg Raise   Lie on a firm bed or floor with both legs extended in front of you.  Keeping one leg in contact with the floor, bend the other knee so that your foot can rest flat on the floor.  Find your neutral spine, and tense your abdominal muscles to maintain your spinal position throughout the exercise.  Slowly lift your straight leg off the floor about 6 inches for a count of   15, making sure to not hold your breath.  Still keeping your neutral spine, slowly lower your leg all the way to the floor. Repeat this exercise with each leg __________ times. Complete this exercise __________ times per day. POSTURE AND BODY MECHANICS CONSIDERATIONS - Low Back Sprain Keeping correct posture when sitting, standing or completing your activities will reduce the stress put on different body tissues, allowing injured tissues a chance to heal and limiting painful experiences. The following are general guidelines for improved posture. Your physician or physical therapist will provide you with any instructions specific to your needs. While reading these guidelines, remember:  The exercises prescribed by your provider will help you have the flexibility and strength to maintain correct postures.  The correct posture provides the best environment for your joints to work. All of your joints have less wear and tear when properly supported by a spine with good posture. This means you will experience a healthier, less painful body.  Correct posture must be practiced with all of your activities, especially prolonged sitting and standing. Correct posture is as important when doing repetitive low-stress activities (typing) as it is when doing a single heavy-load  activity (lifting). RESTING POSITIONS Consider which positions are most painful for you when choosing a resting position. If you have pain with flexion-based activities (sitting, bending, stooping, squatting), choose a position that allows you to rest in a less flexed posture. You would want to avoid curling into a fetal position on your side. If your pain worsens with extension-based activities (prolonged standing, working overhead), avoid resting in an extended position such as sleeping on your stomach. Most people will find more comfort when they rest with their spine in a more neutral position, neither too rounded nor too arched. Lying on a non-sagging bed on your side with a pillow between your knees, or on your back with a pillow under your knees will often provide some relief. Keep in mind, being in any one position for a prolonged period of time, no matter how correct your posture, can still lead to stiffness. PROPER SITTING POSTURE In order to minimize stress and discomfort on your spine, you must sit with correct posture. Sitting with good posture should be effortless for a healthy body. Returning to good posture is a gradual process. Many people can work toward this most comfortably by using various supports until they have the flexibility and strength to maintain this posture on their own. When sitting with proper posture, your ears will fall over your shoulders and your shoulders will fall over your hips. You should use the back of the chair to support your upper back. Your lower back will be in a neutral position, just slightly arched. You may place a small pillow or folded towel at the base of your lower back for  support.  When working at a desk, create an environment that supports good, upright posture. Without extra support, muscles tire, which leads to excessive strain on joints and other tissues. Keep these recommendations in mind: CHAIR:  A chair should be able to slide under your desk  when your back makes contact with the back of the chair. This allows you to work closely.  The chair's height should allow your eyes to be level with the upper part of your monitor and your hands to be slightly lower than your elbows. BODY POSITION  Your feet should make contact with the floor. If this is not possible, use a foot rest.  Keep your ears   over your shoulders. This will reduce stress on your neck and low back. INCORRECT SITTING POSTURES  If you are feeling tired and unable to assume a healthy sitting posture, do not slouch or slump. This puts excessive strain on your back tissues, causing more damage and pain. Healthier options include:  Using more support, like a lumbar pillow.  Switching tasks to something that requires you to be upright or walking.  Talking a brief walk.  Lying down to rest in a neutral-spine position. PROLONGED STANDING WHILE SLIGHTLY LEANING FORWARD  When completing a task that requires you to lean forward while standing in one place for a long time, place either foot up on a stationary 2-4 inch high object to help maintain the best posture. When both feet are on the ground, the lower back tends to lose its slight inward curve. If this curve flattens (or becomes too large), then the back and your other joints will experience too much stress, tire more quickly, and can cause pain. CORRECT STANDING POSTURES Proper standing posture should be assumed with all daily activities, even if they only take a few moments, like when brushing your teeth. As in sitting, your ears should fall over your shoulders and your shoulders should fall over your hips. You should keep a slight tension in your abdominal muscles to brace your spine. Your tailbone should point down to the ground, not behind your body, resulting in an over-extended swayback posture.  INCORRECT STANDING POSTURES  Common incorrect standing postures include a forward head, locked knees and/or an excessive  swayback. WALKING Walk with an upright posture. Your ears, shoulders and hips should all line-up. PROLONGED ACTIVITY IN A FLEXED POSITION When completing a task that requires you to bend forward at your waist or lean over a low surface, try to find a way to stabilize 3 out of 4 of your limbs. You can place a hand or elbow on your thigh or rest a knee on the surface you are reaching across. This will provide you more stability, so that your muscles do not tire as quickly. By keeping your knees relaxed, or slightly bent, you will also reduce stress across your lower back. CORRECT LIFTING TECHNIQUES DO :  Assume a wide stance. This will provide you more stability and the opportunity to get as close as possible to the object which you are lifting.  Tense your abdominals to brace your spine. Bend at the knees and hips. Keeping your back locked in a neutral-spine position, lift using your leg muscles. Lift with your legs, keeping your back straight.  Test the weight of unknown objects before attempting to lift them.  Try to keep your elbows locked down at your sides in order get the best strength from your shoulders when carrying an object.  Always ask for help when lifting heavy or awkward objects. INCORRECT LIFTING TECHNIQUES DO NOT:   Lock your knees when lifting, even if it is a small object.  Bend and twist. Pivot at your feet or move your feet when needing to change directions.  Assume that you can safely pick up even a paperclip without proper posture.   This information is not intended to replace advice given to you by your health care provider. Make sure you discuss any questions you have with your health care provider.   Document Released: 09/05/2005 Document Revised: 09/26/2014 Document Reviewed: 12/18/2008 [employer] Patient Education 2016 [employer]  

## 2015-09-08 ENCOUNTER — Other Ambulatory Visit (INDEPENDENT_AMBULATORY_CARE_PROVIDER_SITE_OTHER): Payer: BC Managed Care – PPO

## 2015-09-08 DIAGNOSIS — E038 Other specified hypothyroidism: Secondary | ICD-10-CM

## 2015-09-09 LAB — T4, FREE: FREE T4: 1.44 ng/dL (ref 0.82–1.77)

## 2015-09-09 LAB — TSH: TSH: 0.193 u[IU]/mL — ABNORMAL LOW (ref 0.450–4.500)

## 2015-09-23 ENCOUNTER — Other Ambulatory Visit: Payer: Self-pay | Admitting: Family Medicine

## 2015-10-07 ENCOUNTER — Ambulatory Visit (INDEPENDENT_AMBULATORY_CARE_PROVIDER_SITE_OTHER): Payer: BC Managed Care – PPO | Admitting: Family

## 2015-10-07 ENCOUNTER — Encounter: Payer: Self-pay | Admitting: Family

## 2015-10-07 VITALS — BP 124/79 | HR 61 | Temp 98.5°F | Ht 65.0 in | Wt 120.4 lb

## 2015-10-07 DIAGNOSIS — J0100 Acute maxillary sinusitis, unspecified: Secondary | ICD-10-CM | POA: Diagnosis not present

## 2015-10-07 MED ORDER — AZITHROMYCIN 250 MG PO TABS: ORAL_TABLET | ORAL | Status: DC

## 2015-10-07 MED ORDER — FLUTICASONE PROPIONATE 50 MCG/ACT NA SUSP: 2.0000 | Freq: Every day | NASAL | Status: DC

## 2015-10-07 NOTE — Patient Instructions (Signed)
Sinusitis, Adult Sinusitis is redness, soreness, and inflammation of the paranasal sinuses. Paranasal sinuses are air pockets within the bones of your face. They are located beneath your eyes, in the middle of your forehead, and above your eyes. In healthy paranasal sinuses, mucus is able to drain out, and air is able to circulate through them by way of your nose. However, when your paranasal sinuses are inflamed, mucus and air can become trapped. This can allow bacteria and other germs to grow and cause infection. Sinusitis can develop quickly and last only a short time (acute) or continue over a long period (chronic). Sinusitis that lasts for more than 12 weeks is considered chronic. CAUSES Causes of sinusitis include:  Allergies.  Structural abnormalities, such as displacement of the cartilage that separates your nostrils (deviated septum), which can decrease the air flow through your nose and sinuses and affect sinus drainage.  Functional abnormalities, such as when the small hairs (cilia) that line your sinuses and help remove mucus do not work properly or are not present. SIGNS AND SYMPTOMS Symptoms of acute and chronic sinusitis are the same. The primary symptoms are pain and pressure around the affected sinuses. Other symptoms include:  Upper toothache.  Earache.  Headache.  Bad breath.  Decreased sense of smell and taste.  A cough, which worsens when you are lying flat.  Fatigue.  Fever.  Thick drainage from your nose, which often is green and may contain pus (purulent).  Swelling and warmth over the affected sinuses. DIAGNOSIS Your health care provider will perform a physical exam. During your exam, your health care provider may perform any of the following to help determine if you have acute sinusitis or chronic sinusitis:  Look in your nose for signs of abnormal growths in your nostrils (nasal polyps).  Tap over the affected sinus to check for signs of  infection.  View the inside of your sinuses using an imaging device that has a light attached (endoscope). If your health care provider suspects that you have chronic sinusitis, one or more of the following tests may be recommended:  Allergy tests.  Nasal culture. A sample of mucus is taken from your nose, sent to a lab, and screened for bacteria.  Nasal cytology. A sample of mucus is taken from your nose and examined by your health care provider to determine if your sinusitis is related to an allergy. TREATMENT Most cases of acute sinusitis are related to a viral infection and will resolve on their own within 10 days. Sometimes, medicines are prescribed to help relieve symptoms of both acute and chronic sinusitis. These may include pain medicines, decongestants, nasal steroid sprays, or saline sprays. However, for sinusitis related to a bacterial infection, your health care provider will prescribe antibiotic medicines. These are medicines that will help kill the bacteria causing the infection. Rarely, sinusitis is caused by a fungal infection. In these cases, your health care provider will prescribe antifungal medicine. For some cases of chronic sinusitis, surgery is needed. Generally, these are cases in which sinusitis recurs more than 3 times per year, despite other treatments. HOME CARE INSTRUCTIONS  Drink plenty of water. Water helps thin the mucus so your sinuses can drain more easily.  Use a humidifier.  Inhale steam 3-4 times a day (for example, sit in the bathroom with the shower running).  Apply a warm, moist washcloth to your face 3-4 times a day, or as directed by your health care provider.  Use saline nasal sprays to help   moisten and clean your sinuses.  Take medicines only as directed by your health care provider.  If you were prescribed either an antibiotic or antifungal medicine, finish it all even if you start to feel better. SEEK IMMEDIATE MEDICAL CARE IF:  You have  increasing pain or severe headaches.  You have nausea, vomiting, or drowsiness.  You have swelling around your face.  You have vision problems.  You have a stiff neck.  You have difficulty breathing.   This information is not intended to replace advice given to you by your health care provider. Make sure you discuss any questions you have with your health care provider.   Document Released: 09/05/2005 Document Revised: 09/26/2014 Document Reviewed: 09/20/2011 Elsevier Interactive Patient Education 2016 [employer]  - Take meds as prescribed - Use a cool mist humidifier  -Use saline nose sprays frequently -Saline irrigations of the nose can be very helpful if done frequently.  * 4X daily for 1 week*  * Use of a nettie pot can be helpful with this. Follow directions with this* -Force fluids -For any cough or congestion  Use plain Mucinex- regular strength or max strength is fine   * Children- consult with Pharmacist for dosing -For fever or aces or pains- take tylenol or ibuprofen appropriate for age and weight.  * for fevers greater than 101 orally you may alternate ibuprofen and tylenol every  3 hours. -Throat lozenges if help   Tashona Calk, FNP   

## 2015-10-07 NOTE — Progress Notes (Signed)
Subjective:    Patient ID: April Herrera, female    DOB: 14-May-1966, 50 y.o.   MRN: [id number]  Sinus Problem This is a new problem. The current episode started 1 to 4 weeks ago. The problem has been gradually worsening since onset. There has been no fever. Her pain is at a severity of 5/10. The pain is moderate. Associated symptoms include congestion, coughing, ear pain, headaches, a hoarse voice, sinus pressure, sneezing and a sore throat. Pertinent negatives include no chills, shortness of breath or swollen glands. (Thick green mucus ) Past treatments include oral decongestants, saline sprays, sitting up, lying down and acetaminophen ("allery pill", and using netti pot everyday). The treatment provided mild relief.      Review of Systems  Constitutional: Negative.  Negative for chills.  HENT: Positive for congestion, ear pain, hoarse voice, sinus pressure, sneezing and sore throat.   Eyes: Negative.   Respiratory: Positive for cough. Negative for shortness of breath.   Cardiovascular: Negative.  Negative for palpitations.  Gastrointestinal: Negative.   Endocrine: Negative.   Genitourinary: Negative.   Musculoskeletal: Negative.   Neurological: Positive for headaches.  Hematological: Negative.   Psychiatric/Behavioral: Negative.   All other systems reviewed and are negative.      Objective:   Physical Exam  Constitutional: She is oriented to person, place, and time. She appears well-developed and well-nourished. No distress.  HENT:  Head: Normocephalic and atraumatic.  Right Ear: External ear normal.  Left Ear: External ear normal.  Nose: Right sinus exhibits maxillary sinus tenderness and frontal sinus tenderness. Left sinus exhibits maxillary sinus tenderness and frontal sinus tenderness.  Nasal passage erythemas with moderate swelling Oropharynx erythemas   Eyes: Pupils are equal, round, and reactive to light.  Neck: Normal range of motion. Neck supple. No  thyromegaly present.  Cardiovascular: Normal rate, regular rhythm, normal heart sounds and intact distal pulses.   No murmur heard. Pulmonary/Chest: Effort normal and breath sounds normal. No respiratory distress. She has no wheezes.  Abdominal: Soft. Bowel sounds are normal. She exhibits no distension. There is no tenderness.  Musculoskeletal: Normal range of motion. She exhibits no edema or tenderness.  Neurological: She is alert and oriented to person, place, and time. She has normal reflexes. No cranial nerve deficit.  Skin: Skin is warm and dry.  Psychiatric: She has a normal mood and affect. Her behavior is normal. Judgment and thought content normal.  Vitals reviewed.     BP 124/79 mmHg  Pulse 61  Temp(Src) 98.5 F (36.9 C) (Oral)  Ht  (1.651 m)  Wt 120 lb 6.4 oz (54.613 kg)  BMI 20.04 kg/m2  LMP 01/27/2012     Assessment & Plan:  1. Acute maxillary sinusitis, recurrence not specified -- Take meds as prescribed - Use a cool mist humidifier  -Use saline nose sprays frequently -Saline irrigations of the nose can be very helpful if done frequently.  * 4X daily for 1 week*  * Use of a nettie pot can be helpful with this. Follow directions with this* -Force fluids -For any cough or congestion  Use plain Mucinex- regular strength or max strength is fine   * Children- consult with Pharmacist for dosing -For fever or aces or pains- take tylenol or ibuprofen appropriate for age and weight.  * for fevers greater than 101 orally you may alternate ibuprofen and tylenol every  3 hours. -Throat lozenges if help - azithromyciLACHE DAGHERAK) 250 MG tablet; As directed  Dispense: 1  each; Refill: 0 - fluticasone (FLONASE) 50 MCG/ACT nasal spray; Place 2 sprays into both nostrils daily.  Dispense: 16 g; Refill: 6  Jannifer Rodney, FNP

## 2016-02-17 ENCOUNTER — Encounter: Payer: Self-pay | Admitting: Physician Assistant

## 2016-02-17 ENCOUNTER — Ambulatory Visit (INDEPENDENT_AMBULATORY_CARE_PROVIDER_SITE_OTHER): Payer: BC Managed Care – PPO | Admitting: Physician Assistant

## 2016-02-17 VITALS — BP 122/81 | HR 78 | Temp 99.9°F | Ht 65.0 in | Wt 119.6 lb

## 2016-02-17 DIAGNOSIS — J0100 Acute maxillary sinusitis, unspecified: Secondary | ICD-10-CM | POA: Diagnosis not present

## 2016-02-17 MED ORDER — AZITHROMYCIN 250 MG PO TABS: ORAL_TABLET | ORAL | Status: DC

## 2016-02-17 NOTE — Progress Notes (Signed)
Subjective:     Patient ID: April Herrera, female   DOB: 10/03/1965, 50 y.o.   MRN: [id number]  HPI Pt with sinus pain and pressure x 3 weeks She has used OTC antihist/decongest and Nettie pot with minimal relief Hx of sinusitis  Review of Systems  Constitutional: Positive for fever and chills.  HENT: Positive for congestion, postnasal drip, rhinorrhea, sinus pressure and sore throat. Negative for ear discharge and ear pain.   Respiratory: Positive for cough. Negative for shortness of breath.   Cardiovascular: Negative.        Objective:   Physical Exam  Constitutional: She appears well-developed and well-nourished.  HENT:  Right Ear: External ear normal.  Left Ear: External ear normal.  Mouth/Throat: No oropharyngeal exudate.  + frontal/maxillary sinus TTP  Neck: Neck supple.  Cardiovascular: Normal rate, regular rhythm and normal heart sounds.   No murmur heard. Pulmonary/Chest: Effort normal and breath sounds normal.  Lymphadenopathy:    She has no cervical adenopathy.  Nursing note and vitals reviewed.      Assessment:     1. Acute maxillary sinusitis, recurrence not specified        Plan:     Zpack Continue with Flonase and antihist/decongest Fluids Rest F/U prn

## 2016-02-17 NOTE — Patient Instructions (Signed)

## 2016-03-28 ENCOUNTER — Telehealth: Payer: Self-pay | Admitting: Family Medicine

## 2016-03-28 ENCOUNTER — Other Ambulatory Visit: Payer: BC Managed Care – PPO

## 2016-03-28 ENCOUNTER — Other Ambulatory Visit: Payer: Self-pay | Admitting: Family Medicine

## 2016-03-28 DIAGNOSIS — E039 Hypothyroidism, unspecified: Secondary | ICD-10-CM

## 2016-03-28 NOTE — Telephone Encounter (Signed)
Labs ordered. Also due check up (She's come in for illness, but no well visit in the last year.)

## 2016-03-28 NOTE — Telephone Encounter (Signed)
Pt is aware and will come in today to have her labs drawn and schedule her follow up appt at that time.

## 2016-03-29 LAB — CBC WITH DIFFERENTIAL/PLATELET
BASOS: 0 %
Basophils Absolute: 0 10*3/uL (ref 0.0–0.2)
EOS (ABSOLUTE): 0.4 10*3/uL (ref 0.0–0.4)
EOS: 3 %
Hematocrit: 42.5 % (ref 34.0–46.6)
Hemoglobin: 13.7 g/dL (ref 11.1–15.9)
IMMATURE GRANS (ABS): 0 10*3/uL (ref 0.0–0.1)
IMMATURE GRANULOCYTES: 0 %
LYMPHS: 34 %
Lymphocytes Absolute: 3.6 10*3/uL — ABNORMAL HIGH (ref 0.7–3.1)
MCH: 30.6 pg (ref 26.6–33.0)
MCHC: 32.2 g/dL (ref 31.5–35.7)
MCV: 95 fL (ref 79–97)
Monocytes Absolute: 0.9 10*3/uL (ref 0.1–0.9)
Monocytes: 8 %
NEUTROS PCT: 55 %
Neutrophils Absolute: 5.8 10*3/uL (ref 1.4–7.0)
PLATELETS: 349 10*3/uL (ref 150–379)
RBC: 4.47 x10E6/uL (ref 3.77–5.28)
RDW: 12.9 % (ref 12.3–15.4)
WBC: 10.6 10*3/uL (ref 3.4–10.8)

## 2016-03-29 LAB — TSH+FREE T4
Free T4: 1.53 ng/dL (ref 0.82–1.77)
TSH: 0.192 u[IU]/mL — AB (ref 0.450–4.500)

## 2016-03-29 LAB — CMP14+EGFR
A/G RATIO: 1.8 (ref 1.2–2.2)
ALT: 13 IU/L (ref 0–32)
AST: 22 IU/L (ref 0–40)
Albumin: 4.5 g/dL (ref 3.5–5.5)
Alkaline Phosphatase: 73 IU/L (ref 39–117)
BUN/Creatinine Ratio: 15 (ref 9–23)
BUN: 12 mg/dL (ref 6–24)
Bilirubin Total: 0.5 mg/dL (ref 0.0–1.2)
CALCIUM: 9.2 mg/dL (ref 8.7–10.2)
CHLORIDE: 100 mmol/L (ref 96–106)
CO2: 26 mmol/L (ref 18–29)
Creatinine, Ser: 0.81 mg/dL (ref 0.57–1.00)
GFR calc Af Amer: 97 mL/min/{1.73_m2} (ref >59)
GFR, EST NON AFRICAN AMERICAN: 84 mL/min/{1.73_m2} (ref >59)
Globulin, Total: 2.5 g/dL (ref 1.5–4.5)
Glucose: 87 mg/dL (ref 65–99)
POTASSIUM: 4.3 mmol/L (ref 3.5–5.2)
SODIUM: 141 mmol/L (ref 134–144)
TOTAL PROTEIN: 7 g/dL (ref 6.0–8.5)

## 2016-04-01 ENCOUNTER — Other Ambulatory Visit: Payer: Self-pay | Admitting: *Deleted

## 2016-04-01 MED ORDER — LEVOTHYROXINE SODIUM 88 MCG PO TABS: ORAL_TABLET | ORAL | Status: DC

## 2016-04-01 NOTE — Progress Notes (Signed)
Pt notified of results Verbalizes understanding [facility] Okayed per Dr Darlyn ReadStacks

## 2016-04-12 ENCOUNTER — Other Ambulatory Visit: Payer: Self-pay | Admitting: Family Medicine

## 2016-04-12 DIAGNOSIS — Z1231 Encounter for screening mammogram for malignant neoplasm of breast: Secondary | ICD-10-CM

## 2016-04-13 ENCOUNTER — Encounter: Payer: Self-pay | Admitting: Family Medicine

## 2016-04-13 ENCOUNTER — Ambulatory Visit (INDEPENDENT_AMBULATORY_CARE_PROVIDER_SITE_OTHER): Payer: BC Managed Care – PPO | Admitting: Family Medicine

## 2016-04-13 VITALS — BP 113/73 | HR 76 | Temp 99.1°F | Ht 65.0 in | Wt 121.2 lb

## 2016-04-13 DIAGNOSIS — J0101 Acute recurrent maxillary sinusitis: Secondary | ICD-10-CM | POA: Diagnosis not present

## 2016-04-13 DIAGNOSIS — E039 Hypothyroidism, unspecified: Secondary | ICD-10-CM

## 2016-04-13 MED ORDER — LEVOTHYROXINE SODIUM 88 MCG PO TABS: ORAL_TABLET | ORAL | 1 refills | Status: DC

## 2016-04-13 MED ORDER — PSEUDOEPHEDRINE-GUAIFENESIN ER 120-1200 MG PO TB12: 1.0000 | ORAL_TABLET | Freq: Two times a day (BID) | ORAL | 0 refills | Status: DC

## 2016-04-13 MED ORDER — BETAMETHASONE SOD PHOS & ACET 6 (3-3) MG/ML IJ SUSP
12.0000 mg | Freq: Once | INTRAMUSCULAR | Status: AC
  Administered 2016-04-13: 12 mg via INTRAMUSCULAR

## 2016-04-13 MED ORDER — AMOXICILLIN-POT CLAVULANATE 875-125 MG PO TABS: 1.0000 | ORAL_TABLET | Freq: Two times a day (BID) | ORAL | 0 refills | Status: DC

## 2016-04-13 NOTE — Progress Notes (Addendum)
Subjective:  Patient ID: April Herrera, female    DOB: 07-21-1966  Age: 50 y.o. MRN: [id number]  CC: Sinusitis (sinus pressure, headache, ear pressure, head congestion)   HPI April Herrera presents for Symptoms include congestion, facial pain, nasal congestion,   fever, non productive cough, post nasal drip and sinus pressure. Onset of symptoms was a few days ago, gradually worsening since that time. Pt.is drinking moderate amounts of fluids.    Patient presents for follow-up on  thyroid. The patient has a history of hypothyroidism for many years. It has been stable recently. Pt. denies any change in  voice, loss of hair, heat or cold intolerance. Energy level has been adequate to good. Patient denies constipation and diarrhea. No myxedema. Medication is as noted below. Verified that pt is taking it daily on an empty stomach. Well tolerated.    History [name] has a past medical history of Hypotension; Osteopenia; and Thyroid disease.   April Herrera has no past surgical history on file.   Her family history includes Cancer in her father and mother.April Herrera reports that April Herrera quit smoking about 8 years ago. Her smoking use included Cigarettes. April Herrera has never used smokeless tobacco. April Herrera reports that April Herrera drinks about 1.8 oz of alcohol per week . April Herrera reports that April Herrera does not use drugs.    ROS Review of Systems  Constitutional: Negative for activity change, appetite change, chills and fever.  HENT: Positive for congestion, ear pain, postnasal drip, rhinorrhea and sinus pressure. Negative for ear discharge, hearing loss, nosebleeds, sneezing, sore throat and trouble swallowing.   Eyes: Negative for visual disturbance.  Respiratory: Negative for cough, chest tightness and shortness of breath.   Cardiovascular: Negative for chest pain and palpitations.  Gastrointestinal: Negative for abdominal pain, diarrhea and nausea.  Genitourinary: Negative for dysuria.  Musculoskeletal: Negative for  arthralgias and myalgias.  Skin: Negative for rash.    Objective:  BP 113/73 (BP Location: Left Arm, Patient Position: Sitting, Cuff Size: Normal)   Pulse 76   Temp 99.1 F (37.3 C) (Oral)   Ht 5\' 5"  (1.651 m)   Wt 121 lb 3.2 oz (55 kg)   LMP 01/27/2012   BMI 20.17 kg/m   BP Readings from Last 3 Encounters:  04/13/16 113/73  02/17/16 122/81  10/07/15 124/79    Wt Readings from Last 3 Encounters:  04/13/16 121 lb 3.2 oz (55 kg)  02/17/16 119 lb 9.6 oz (54.3 kg)  10/07/15 120 lb 6.4 oz (54.6 kg)     Physical Exam  Constitutional: April Herrera is oriented to person, place, and time. April Herrera appears well-developed and well-nourished. No distress.  HENT:  Head: Normocephalic and atraumatic.  Right Ear: External ear normal. A middle ear effusion is present. No decreased hearing is noted.  Left Ear: Tympanic membrane and external ear normal. No decreased hearing is noted.  Nose: Mucosal edema present. Right sinus exhibits maxillary sinus tenderness. Right sinus exhibits no frontal sinus tenderness. Left sinus exhibits maxillary sinus tenderness. Left sinus exhibits no frontal sinus tenderness.  Mouth/Throat: No oropharyngeal exudate or posterior oropharyngeal erythema.  Eyes: Conjunctivae are normal. Pupils are equal, round, and reactive to light.  Neck: Normal range of motion. Neck supple. No Brudzinski's sign noted. No thyromegaly present.  Cardiovascular: Normal rate, regular rhythm and normal heart sounds.   No murmur heard. Pulmonary/Chest: Effort normal and breath sounds normal. No respiratory distress. April Herrera has no wheezes. April Herrera has no rales.  Abdominal: Soft. Bowel sounds are normal. April Herrera exhibits no  distension. There is no tenderness.  Musculoskeletal: Normal range of motion.  Lymphadenopathy:       Head (right side): No preauricular adenopathy present.       Head (left side): No preauricular adenopathy present.    April Herrera has no cervical adenopathy.       Right cervical: No superficial  cervical adenopathy present.      Left cervical: No superficial cervical adenopathy present.  Neurological: April Herrera is alert and oriented to person, place, and time.  Skin: Skin is warm and dry.  Psychiatric: April Herrera has a normal mood and affect. Her behavior is normal. Judgment and thought content normal.     Lab Results  Component Value Date   WBC 10.6 03/28/2016   HCT 42.5 03/28/2016   PLT 349 03/28/2016   GLUCOSE 87 03/28/2016   CHOL 160 04/01/2014   TRIG 102 04/01/2014   HDL 64 04/01/2014   LDLCALC 76 04/01/2014   ALT 13 03/28/2016   AST 22 03/28/2016   NA 141 03/28/2016   K 4.3 03/28/2016   CL 100 03/28/2016   CREATININE 0.81 03/28/2016   BUN 12 03/28/2016   CO2 26 03/28/2016   TSH 0.192 (L) 03/28/2016    Mm Digital Screening Bilateral  Result Date: 04/14/2015 CLINICAL DATA:  Screening. EXAM: DIGITAL SCREENING BILATERAL MAMMOGRAM WITH CAD COMPARISON:  Previous exam(s). ACR Breast Density Category d: The breast tissue is extremely dense, which lowers the sensitivity of mammography. FINDINGS: There are no findings suspicious for malignancy. Images were processed with CAD. IMPRESSION: No mammographic evidence of malignancy. A result letter of this screening mammogram will be mailed directly to the patient. RECOMMENDATION: Screening mammogram in one year. (Code:SM-B-01Y) BI-RADS CATEGORY  1: Negative. Electronically Signed   By: Edwin Cap M.D.   On: 04/14/2015 15:20    Assessment & Plan:   Shayann was seen today for sinusitis.  Diagnoses and all orders for this visit:  Hypothyroidism, unspecified hypothyroidism type  Acute recurrent maxillary sinusitis -     betamethasone acetate-betamethasone sodium phosphate (CELESTONE) injection 12 mg; Inject 2 mLs (12 mg total) into the muscle once.  Other orders -     amoxicillin-clavulanate (AUGMENTIN) 875-125 MG tablet; Take 1 tablet by mouth 2 (two) times daily. Take all of this medication -     Pseudoephedrine-Guaifenesin  412 [id number] MG TB12; Take 1 tablet by mouth 2 (two) times daily. For congestion -     levothyroxine (SYNTHROID, LEVOTHROID) 88 MCG tablet; TAKE ONE TABLET BY MOUTH ONCE DAILY BEFORE BREAKFAST      I have discontinued Ms. Jarrett's azithromycin. I am also having her start on amoxicillin-clavulanate and Pseudoephedrine-Guaifenesin. Additionally, I am having her maintain her Fish Oil-Cholecalciferol (FISH OIL + D3 PO), Calcium Citrate-Vitamin D (CALCIUM + D PO), Multiple Vitamins-Minerals (MULTI COMPLETE PO), fluticasone, and levothyroxine. We will continue to administer betamethasone acetate-betamethasone sodium phosphate.  Meds ordered this encounter  Medications  . amoxicillin-clavulanate (AUGMENTIN) 875-125 MG tablet    Sig: Take 1 tablet by mouth 2 (two) times daily. Take all of this medication    Dispense:  20 tablet    Refill:  0  . Pseudoephedrine-Guaifenesin [phone] MG TB12    Sig: Take 1 tablet by mouth 2 (two) times daily. For congestion    Dispense:  20 each    Refill:  0  . levothyroxine (SYNTHROID, LEVOTHROID) 88 MCG tablet    Sig: TAKE ONE TABLET BY MOUTH ONCE DAILY BEFORE BREAKFAST    Dispense:  90 tablet  Refill:  1  . betamethasone acetate-betamethasone sodium phosphate (CELESTONE) injection 12 mg     Follow-up: Return in about 6 months (around 10/14/2016).  Mechele Claude, M.D.

## 2016-04-13 NOTE — Addendum Note (Signed)
Addended by: Mechele Claude on: 04/13/2016 01:43 PM   Modules accepted: Orders

## 2016-04-20 ENCOUNTER — Ambulatory Visit: Payer: Self-pay | Admitting: Family Medicine

## 2016-04-21 ENCOUNTER — Ambulatory Visit
Admission: RE | Admit: 2016-04-21 | Discharge: 2016-04-21 | Disposition: A | Discharge status: Home / Self Care | Payer: BC Managed Care – PPO | Source: Ambulatory Visit | Attending: Family Medicine | Admitting: Family Medicine

## 2016-04-21 ENCOUNTER — Ambulatory Visit: Payer: BC Managed Care – PPO

## 2016-04-21 DIAGNOSIS — Z1231 Encounter for screening mammogram for malignant neoplasm of breast: Secondary | ICD-10-CM

## 2016-05-10 ENCOUNTER — Telehealth: Payer: Self-pay | Admitting: Family Medicine

## 2016-05-10 ENCOUNTER — Other Ambulatory Visit: Payer: Self-pay | Admitting: Family Medicine

## 2016-05-10 MED ORDER — LEVOTHYROXINE SODIUM 75 MCG PO TABS: ORAL_TABLET | ORAL | 5 refills | Status: DC

## 2016-05-10 NOTE — Telephone Encounter (Signed)
The requested med has been sent to the pharmacy.  Please let the patient know. Thanks, WS 

## 2016-05-10 NOTE — Telephone Encounter (Signed)
Left detailed message that requested rx was sent to the pharmacy and to call back with any questions or concerns.

## 2016-05-31 ENCOUNTER — Other Ambulatory Visit: Payer: Self-pay | Admitting: Nurse Practitioner

## 2016-05-31 ENCOUNTER — Other Ambulatory Visit (HOSPITAL_COMMUNITY)
Admission: RE | Admit: 2016-05-31 | Discharge: 2016-05-31 | Disposition: A | Discharge status: Home / Self Care | Payer: BC Managed Care – PPO | Source: Ambulatory Visit | Attending: Nurse Practitioner | Admitting: Nurse Practitioner

## 2016-05-31 DIAGNOSIS — Z01419 Encounter for gynecological examination (general) (routine) without abnormal findings: Secondary | ICD-10-CM | POA: Diagnosis present

## 2016-05-31 DIAGNOSIS — Z1151 Encounter for screening for human papillomavirus (HPV): Secondary | ICD-10-CM | POA: Insufficient documentation

## 2016-06-02 LAB — CYTOLOGY - PAP

## 2016-06-04 ENCOUNTER — Encounter: Payer: Self-pay | Admitting: Pediatrics

## 2016-06-04 ENCOUNTER — Ambulatory Visit (INDEPENDENT_AMBULATORY_CARE_PROVIDER_SITE_OTHER): Payer: BC Managed Care – PPO | Admitting: Pediatrics

## 2016-06-04 ENCOUNTER — Ambulatory Visit: Payer: BC Managed Care – PPO

## 2016-06-04 VITALS — BP 113/73 | Temp 98.3°F | Ht 65.0 in | Wt 116.0 lb

## 2016-06-04 DIAGNOSIS — J069 Acute upper respiratory infection, unspecified: Secondary | ICD-10-CM

## 2016-06-04 NOTE — Addendum Note (Signed)
Addended by: Gwenith DailyHUDY, Sydney Azure N on: 06/04/2016 12:51 PM   Modules accepted: Kipp BroodSmartSet

## 2016-06-04 NOTE — Patient Instructions (Signed)
Netipot with distilled water 2-3 times a day to clear out sinuses Or Normal saline nasal spray Flonase steroid nasal spray antihistamines Ibuprofen 600mg  three times a day Lots of fluids

## 2016-06-04 NOTE — Progress Notes (Signed)
  Subjective:   Patient ID: April Herrera, female    DOB: 12/29/1965, 50 y.o.   MRN: [id number] CC: Cough (Symptoms came on suddenly 3 days ago); Nasal Congestion (taking decongestants and using Neti Pot); and Sore Throat (raw from post nasal drainage)  HPI: April Herrera is a 50 y.o. female presenting for Cough (Symptoms came on suddenly 3 days ago); Nasal Congestion (taking decongestants and using Neti Pot); and Sore Throat (raw from post nasal drainage)  Started with very sore thoat 3 days ago Taking antihistamine, decongestant, neti pots Coughing started last  Minimal sore throat now Temperature last night 99.4 Taking some ibuprofen 3-4 times a day  Relevant past medical, surgical, family and social history reviewed. Allergies and medications reviewed and updated. History  Smoking Status  . Former Smoker  . Types: Cigarettes  . Quit date: 09/20/2007  Smokeless Tobacco  . Never Used   ROS: Per HPI   Objective:    BP (!) 141/90 (BP Location: Right Arm, Patient Position: Sitting, Cuff Size: Small)   Temp 98.3 F (36.8 C) (Oral)   Ht 5\' 5"  (1.651 m)   Wt 116 lb (52.6 kg)   LMP 01/27/2012   BMI 19.30 kg/m   Wt Readings from Last 3 Encounters:  06/04/16 116 lb (52.6 kg)  04/13/16 121 lb 3.2 oz (55 kg)  02/17/16 119 lb 9.6 oz (54.3 kg)    Gen: NAD, alert, cooperative with exam, NCAT EYES: EOMI, no conjunctival injection, or no icterus ENT:  TMs dull gray b/l, OP without erythema LYMPH: small< 1cm cervical LAD L side CV: NRRR, normal S1/S2, no murmur, distal pulses 2+ b/l Resp: CTABL, no wheezes, normal WOB Abd: +BS, soft, NTND. no guarding or organomegaly Ext: No edema, warm Neuro: Alert and oriented  Assessment & Plan:  April Herrera was seen today for cough, nasal congestion and sore throat.  Diagnoses and all orders for this visit:  Acute URI Discussed symptomatic care Anticipatory guidance, symptoms may continue for 10-14 days, cough may last longer No  indications for abx now Return precautions discussed   Follow up plan: Return if symptoms worsen or fail to improve. Rex Krasarol Ceira Hoeschen, MD [facility]

## 2016-11-07 ENCOUNTER — Telehealth: Payer: Self-pay | Admitting: Family Medicine

## 2016-11-07 MED ORDER — LEVOTHYROXINE SODIUM 75 MCG PO TABS: ORAL_TABLET | ORAL | 1 refills | Status: DC

## 2016-11-07 NOTE — Telephone Encounter (Signed)
Refill sent in  - she is seen every July for this

## 2016-11-08 ENCOUNTER — Ambulatory Visit (INDEPENDENT_AMBULATORY_CARE_PROVIDER_SITE_OTHER): Payer: BC Managed Care – PPO | Admitting: Nurse Practitioner

## 2016-11-08 ENCOUNTER — Encounter: Payer: Self-pay | Admitting: Nurse Practitioner

## 2016-11-08 VITALS — BP 122/77 | HR 61 | Temp 97.1°F | Ht 65.0 in | Wt 119.0 lb

## 2016-11-08 DIAGNOSIS — J0101 Acute recurrent maxillary sinusitis: Secondary | ICD-10-CM

## 2016-11-08 MED ORDER — AMOXICILLIN-POT CLAVULANATE 875-125 MG PO TABS: 1.0000 | ORAL_TABLET | Freq: Two times a day (BID) | ORAL | 0 refills | Status: DC

## 2016-11-08 MED ORDER — CHLORPHEN-PE-ACETAMINOPHEN 4-10-325 MG PO TABS: 1.0000 | ORAL_TABLET | Freq: Four times a day (QID) | ORAL | 0 refills | Status: DC | PRN

## 2016-11-08 MED ORDER — METHYLPREDNISOLONE ACETATE 80 MG/ML IJ SUSP
80.0000 mg | Freq: Once | INTRAMUSCULAR | Status: AC
  Administered 2016-11-08: 80 mg via INTRAMUSCULAR

## 2016-11-08 NOTE — Patient Instructions (Signed)

## 2016-11-08 NOTE — Progress Notes (Addendum)
Subjective:    Patient ID: April Herrera, female    DOB: 04/03/1966, 51 y.o.   MRN: [id number]  HPI Patient comes in today c/o sinus pressure and congestion that has been going on for greater than 10 days. She has developed a slight sore throat and cough started today. Denies fever and body aches.    Review of Systems  Constitutional: Positive for fatigue. Negative for chills and fever.  HENT: Positive for congestion, rhinorrhea, sinus pressure and sore throat. Negative for ear discharge, ear pain, facial swelling, trouble swallowing and voice change.   Respiratory: Positive for cough.   Cardiovascular: Negative.   Gastrointestinal: Negative.   Musculoskeletal: Negative.   Neurological: Negative.   All other systems reviewed and are negative.      Objective:   Physical Exam  Constitutional: She is oriented to person, place, and time. She appears well-developed and well-nourished. No distress.  HENT:  Right Ear: Hearing, tympanic membrane, external ear and ear canal normal.  Left Ear: Hearing, tympanic membrane, external ear and ear canal normal.  Nose: Mucosal edema and rhinorrhea present. Right sinus exhibits maxillary sinus tenderness and frontal sinus tenderness. Left sinus exhibits maxillary sinus tenderness and frontal sinus tenderness.  Mouth/Throat: Uvula is midline, oropharynx is clear and moist and mucous membranes are normal.  Neck: Normal range of motion. Neck supple.  Cardiovascular: Normal rate and regular rhythm.   Pulmonary/Chest: Effort normal and breath sounds normal.  Lymphadenopathy:    She has no cervical adenopathy.  Neurological: She is alert and oriented to person, place, and time.  Skin: Skin is warm.  Psychiatric: She has a normal mood and affect. Her behavior is normal. Judgment and thought content normal.   BP 122/77   Pulse 61   Temp 97.1 F (36.2 C) (Oral)   Ht 5\' 5"  (1.651 m)   Wt 119 lb (54 kg)   LMP 01/27/2012   BMI 19.80 kg/m          Assessment & Plan:  1. Acute recurrent maxillary sinusitis 1. Take meds as prescribed 2. Use a cool mist humidifier especially during the winter months and when heat has been humid. 3. Use saline nose sprays frequently 4. Saline irrigations of the nose can be very helpful if done frequently.  * 4X daily for 1 week*  * Use of a nettie pot can be helpful with this. Follow directions with this* 5. Drink plenty of fluids 6. Keep thermostat turn down low 7.For any cough or congestion  Use plain Mucinex- regular strength or max strength is fine   * Children- consult with Pharmacist for dosing 8. For fever or aces or pains- take tylenol or ibuprofen appropriate for age and weight.  * for fevers greater than 101 orally you may alternate ibuprofen and tylenol every  3 hours.   Meds ordered this encounter  Medications  . amoxicillin-clavulanate (AUGMENTIN) 875-125 MG tablet    Sig: Take 1 tablet by mouth 2 (two) times daily.    Dispense:  20 tablet    Refill:  0    Order Specific Question:   Supervising Provider    Answer:   VINCENT, CAROL L [[id number]]  . Chlorphen-PE-Acetaminophen 4-10-325 MG TABS    Sig: Take 1 tablet by mouth every 6 (six) hours as needed.    Dispense:  10 tablet    Refill:  0    Order Specific Question:   Supervising Provider    Answer:   VINCENT, CAROL L [[id number]]  .  methylPREDNISolone acetate (DEPO-MEDROL) injection 80 mg    Mary-Margaret Daphine Deutscher, FNP

## 2016-11-08 NOTE — Addendum Note (Signed)
Addended by: Bennie PieriniMARTIN, MARY-MARGARET on: 11/08/2016 04:07 PM   Modules accepted: Orders

## 2016-11-15 ENCOUNTER — Other Ambulatory Visit: Payer: Self-pay | Admitting: *Deleted

## 2016-11-15 MED ORDER — LEVOTHYROXINE SODIUM 75 MCG PO TABS: ORAL_TABLET | ORAL | 0 refills | Status: DC

## 2017-01-09 ENCOUNTER — Ambulatory Visit (INDEPENDENT_AMBULATORY_CARE_PROVIDER_SITE_OTHER): Payer: BC Managed Care – PPO | Admitting: Family Medicine

## 2017-01-09 ENCOUNTER — Encounter: Payer: Self-pay | Admitting: Family Medicine

## 2017-01-09 VITALS — BP 111/69 | HR 72 | Temp 99.9°F | Ht 65.0 in | Wt 117.0 lb

## 2017-01-09 DIAGNOSIS — W57XXXA Bitten or stung by nonvenomous insect and other nonvenomous arthropods, initial encounter: Secondary | ICD-10-CM | POA: Diagnosis not present

## 2017-01-09 DIAGNOSIS — S00402A Unspecified superficial injury of left ear, initial encounter: Secondary | ICD-10-CM

## 2017-01-09 MED ORDER — DOXYCYCLINE HYCLATE 100 MG PO TABS: 100.0000 mg | ORAL_TABLET | Freq: Two times a day (BID) | ORAL | 0 refills | Status: DC

## 2017-01-09 NOTE — Progress Notes (Signed)
BP 111/69   Pulse 72   Temp 99.9 F (37.7 C) (Oral)   Ht  (1.651 m)   Wt 117 lb (53.1 kg)   LMP 01/27/2012   BMI 19.47 kg/m    Subjective:    Patient ID: April Herrera, female    DOB: 02-28-1966, 51 y.o.   MRN: [id number]  HPI: April Herrera is a 51 y.o. female presenting on 01/09/2017 for Tick Removal (pulled tick off left ear on Friday, area is red, swollen and irritated)   HPI Tick bite Patient had a tick bite just behind her left ear that she removed 3 days ago. Since then she has had some swelling and redness behind that ear. She denies any abnormal rashes or or fevers that she knew about but she does have a low-grade temperature of 99.9 here in the office. She denies any shortness of breath or joint pains. She denies any abnormal rashes. She denies any swelling or redness anywhere else besides behind the ear where the tick was itself.  Relevant past medical, surgical, family and social history reviewed and updated as indicated. Interim medical history since our last visit reviewed. Allergies and medications reviewed and updated.  Review of Systems  Constitutional: Negative for chills and fever.  Respiratory: Negative for chest tightness and shortness of breath.   Cardiovascular: Negative for chest pain and leg swelling.  Genitourinary: Negative for difficulty urinating and dysuria.  Musculoskeletal: Negative for back pain and gait problem.  Skin: Positive for color change. Negative for rash.  Neurological: Negative for light-headedness and headaches.  Psychiatric/Behavioral: Negative for agitation and behavioral problems.  All other systems reviewed and are negative.   Per HPI unless specifically indicated above        Objective:    BP 111/69   Pulse 72   Temp 99.9 F (37.7 C) (Oral)   Ht  (1.651 m)   Wt 117 lb (53.1 kg)   LMP 01/27/2012   BMI 19.47 kg/m   Wt Readings from Last 3 Encounters:  01/09/17 117 lb (53.1 kg)  11/08/16 119  lb (54 kg)  06/04/16 116 lb (52.6 kg)    Physical Exam  Constitutional: She is oriented to person, place, and time. She appears well-developed and well-nourished. No distress.  Eyes: Conjunctivae are normal.  Cardiovascular: Normal rate, regular rhythm, normal heart sounds and intact distal pulses.   No murmur heard. Pulmonary/Chest: Effort normal and breath sounds normal. No respiratory distress. She has no wheezes. She has no rales.  Musculoskeletal: Normal range of motion. She exhibits no edema or tenderness.  Neurological: She is alert and oriented to person, place, and time. Coordination normal.  Skin: Skin is warm and dry. Lesion (Small eschar with a slightly larger area of erythema behind the left ear.) noted. No rash noted. She is not diaphoretic.  Psychiatric: She has a normal mood and affect. Her behavior is normal.  Nursing note and vitals reviewed.     Assessment & Plan:   Problem List Items Addressed This Visit    None    Visit Diagnoses    Tick bite, initial encounter    -  Primary   Relevant Medications   doxycycline (VIBRA-TABS) 100 MG tablet       Follow up plan: Return if symptoms worsen or fail to improve.  Counseling provided for all of the vaccine components No orders of the defined types were placed in this encounter.   Arville Care, MD [facility] 01/09/2017, 3:59 PM

## 2017-01-28 ENCOUNTER — Other Ambulatory Visit: Payer: Self-pay | Admitting: Family

## 2017-01-28 DIAGNOSIS — J0100 Acute maxillary sinusitis, unspecified: Secondary | ICD-10-CM

## 2017-03-10 ENCOUNTER — Other Ambulatory Visit: Payer: Self-pay | Admitting: Family Medicine

## 2017-03-10 MED ORDER — LEVOTHYROXINE SODIUM 75 MCG PO TABS: ORAL_TABLET | ORAL | 0 refills | Status: DC

## 2017-03-10 NOTE — Telephone Encounter (Signed)
done

## 2017-03-10 NOTE — Telephone Encounter (Signed)
What is the name of the medication? Levothyroxine   Have you contacted your pharmacy to request a refill? NO  Which pharmacy would you like this sent to? [facility]   Patient notified that their request is being sent to the clinical staff for review and that they should receive a call once it is complete. If they do not receive a call within 24 hours they can check with their pharmacy or our office.

## 2017-03-14 ENCOUNTER — Other Ambulatory Visit: Payer: Self-pay | Admitting: Family Medicine

## 2017-03-21 ENCOUNTER — Other Ambulatory Visit: Payer: Self-pay | Admitting: Family Medicine

## 2017-03-21 ENCOUNTER — Telehealth: Payer: Self-pay | Admitting: Family Medicine

## 2017-03-21 DIAGNOSIS — E039 Hypothyroidism, unspecified: Secondary | ICD-10-CM

## 2017-03-21 NOTE — Telephone Encounter (Signed)
Please contact the patient her order was put in. Also, please let her know she is due for recheck in the office as well. See if she can get that scheduled in the next week or 2

## 2017-03-21 NOTE — Telephone Encounter (Signed)
Patient aware and we schedule followup appointment for 03/24/17 at 8:10 am

## 2017-03-24 ENCOUNTER — Encounter: Payer: Self-pay | Admitting: Family Medicine

## 2017-03-24 ENCOUNTER — Ambulatory Visit (INDEPENDENT_AMBULATORY_CARE_PROVIDER_SITE_OTHER): Payer: BC Managed Care – PPO | Admitting: Family Medicine

## 2017-03-24 VITALS — BP 104/70 | HR 64 | Temp 98.2°F | Ht 65.0 in | Wt 118.0 lb

## 2017-03-24 DIAGNOSIS — E039 Hypothyroidism, unspecified: Secondary | ICD-10-CM

## 2017-03-24 MED ORDER — LEVOTHYROXINE SODIUM 75 MCG PO TABS: ORAL_TABLET | ORAL | 1 refills | Status: DC

## 2017-03-24 NOTE — Progress Notes (Signed)
Subjective:  Patient ID: April Herrera, female    DOB: 12/09/1965  Age: 51 y.o. MRN: [id number]  CC: Hypothyroidism (pt here today for routine follow up of her chronic medical conditions)   HPI April Herrera presents for Patient presents for follow-up on  thyroid. The patient has a history of hypothyroidism for many years. It has been stable recently. Pt. denies any change in  voice, loss of hair, heat or cold intolerance. Energy level has been adequate to good. However she says she is a bit wound up recently. Concerned that perhaps her level is a little high. Patient denies  diarrhea. She has chronic constipation that has been no different recently than all of her life. No myxedema. Medication is as noted below. Verified that pt is taking it daily on an empty stomach. Well tolerated.  [facility] 03/24/2017 01/09/2017 06/04/2016  Decreased Interest 0 0 0  Down, Depressed, Hopeless 0 0 0  PHQ - 2 Score 0 0 0    History April Herrera has a past medical history of Hypotension; Osteopenia; and Thyroid disease.   She has no past surgical history on file.   Her family history includes Cancer in her father and mother.She reports that she quit smoking about 9 years ago. Her smoking use included Cigarettes. She has never used smokeless tobacco. She reports that she drinks about 1.8 oz of alcohol per week . She reports that she does not use drugs.    ROS Review of Systems  Constitutional: Negative for activity change, appetite change and fever.  HENT: Negative for congestion, rhinorrhea and sore throat.   Eyes: Negative for visual disturbance.  Respiratory: Negative for cough and shortness of breath.   Cardiovascular: Negative for chest pain and palpitations.  Gastrointestinal: Negative for abdominal pain, diarrhea and nausea.  Genitourinary: Negative for dysuria.  Musculoskeletal: Negative for arthralgias and myalgias.    Objective:  BP 104/70   Pulse 64   Temp 98.2 F  (36.8 C) (Oral)   Ht 5\' 5"  (1.651 m)   Wt 118 lb (53.5 kg)   LMP 01/27/2012   BMI 19.64 kg/m   BP Readings from Last 3 Encounters:  03/24/17 104/70  01/09/17 111/69  11/08/16 122/77    Wt Readings from Last 3 Encounters:  03/24/17 118 lb (53.5 kg)  01/09/17 117 lb (53.1 kg)  11/08/16 119 lb (54 kg)     Physical Exam  Constitutional: She is oriented to person, place, and time. She appears well-developed and well-nourished. No distress.  HENT:  Head: Normocephalic and atraumatic.  Right Ear: External ear normal.  Left Ear: External ear normal.  Nose: Nose normal.  Mouth/Throat: Oropharynx is clear and moist.  Eyes: Conjunctivae and EOM are normal. Pupils are equal, round, and reactive to light.  Neck: Normal range of motion. Neck supple. No thyromegaly present.  Cardiovascular: Normal rate, regular rhythm and normal heart sounds.   No murmur heard. Pulmonary/Chest: Effort normal and breath sounds normal. No respiratory distress. She has no wheezes. She has no rales.  Abdominal: Soft. Bowel sounds are normal. She exhibits no distension. There is no tenderness.  Lymphadenopathy:    She has no cervical adenopathy.  Neurological: She is alert and oriented to person, place, and time. She has normal reflexes.  Skin: Skin is warm and dry.  Psychiatric: She has a normal mood and affect. Her behavior is normal. Judgment and thought content normal.      Assessment & Plan:   April Herrera  was seen today for hypothyroidism.  Diagnoses and all orders for this visit:  Hypothyroidism, unspecified type -     T3, Free  Other orders -     levothyroxine (SYNTHROID, LEVOTHROID) 75 MCG tablet; Take 1 Tablet by mouth once daily BEFORE breakfast       I have discontinued April Herrera's doxycycline. I am also having her maintain her Fish Oil-Cholecalciferol (FISH OIL + D3 PO), Calcium Citrate-Vitamin D (CALCIUM + D PO), Multiple Vitamins-Minerals (MULTI COMPLETE PO), fluticasone, and  levothyroxine.  Allergies as of 03/24/2017      Reactions   Sulfa Antibiotics       Medication List       Accurate as of 03/24/17  8:58 AM. Always use your most recent med list.          CALCIUM + D PO Take by mouth.   FISH OIL + D3 PO Take by mouth.   fluticasone 50 MCG/ACT nasal spray Commonly known as:  FLONASE Use 2 sprays in each nostril once daily   levothyroxine 75 MCG tablet Commonly known as:  SYNTHROID, LEVOTHROID Take 1 Tablet by mouth once daily BEFORE breakfast   MULTI COMPLETE PO Take by mouth.        Follow-up: Return in about 6 months (around 09/24/2017).  Mechele Claude, M.D.

## 2017-03-25 LAB — T3, FREE: T3 FREE: 2.3 pg/mL (ref 2.0–4.4)

## 2017-03-29 LAB — T4, FREE: FREE T4: 1.24 ng/dL (ref 0.82–1.77)

## 2017-03-29 LAB — TSH: TSH: 6.52 u[IU]/mL — AB (ref 0.450–4.500)

## 2017-03-29 LAB — SPECIMEN STATUS REPORT

## 2017-04-03 ENCOUNTER — Other Ambulatory Visit: Payer: Self-pay | Admitting: Family Medicine

## 2017-04-03 ENCOUNTER — Other Ambulatory Visit: Payer: Self-pay | Admitting: *Deleted

## 2017-04-03 MED ORDER — LEVOTHYROXINE SODIUM 88 MCG PO TABS: ORAL_TABLET | ORAL | 0 refills | Status: DC

## 2017-04-03 MED ORDER — LEVOTHYROXINE SODIUM 88 MCG PO TABS: ORAL_TABLET | ORAL | 2 refills | Status: DC

## 2017-04-04 ENCOUNTER — Other Ambulatory Visit: Payer: Self-pay | Admitting: *Deleted

## 2017-04-04 MED ORDER — LEVOTHYROXINE SODIUM 88 MCG PO TABS: ORAL_TABLET | ORAL | 0 refills | Status: DC

## 2017-04-04 NOTE — Telephone Encounter (Signed)
[facility]

## 2017-05-03 ENCOUNTER — Encounter: Payer: Self-pay | Admitting: Pediatrics

## 2017-05-03 ENCOUNTER — Ambulatory Visit (INDEPENDENT_AMBULATORY_CARE_PROVIDER_SITE_OTHER): Payer: BC Managed Care – PPO | Admitting: Pediatrics

## 2017-05-03 VITALS — BP 135/83 | HR 63 | Temp 97.5°F | Ht 65.0 in | Wt 117.0 lb

## 2017-05-03 DIAGNOSIS — J329 Chronic sinusitis, unspecified: Secondary | ICD-10-CM

## 2017-05-03 MED ORDER — AMOXICILLIN-POT CLAVULANATE 875-125 MG PO TABS: 1.0000 | ORAL_TABLET | Freq: Two times a day (BID) | ORAL | 0 refills | Status: DC

## 2017-05-03 NOTE — Progress Notes (Signed)
  Subjective:   Patient ID: April Herrera, female    DOB: 01/19/1966, 51 y.o.   MRN: [id number] CC: Facial Pain (Sinus pressure); Nasal Congestion; and Headache  HPI: April Herrera is a 51 y.o. female presenting for Facial Pain (Sinus pressure); Nasal Congestion; and Headache  Has had three weeks of sinus pressure Teeth are throbbing now Started with draining, then pressure, now face pain  Taking guaifenesin, pseudofed, ibuprofen, neti pot regularly Using flonase No fevers  Relevant past medical, surgical, family and social history reviewed. Allergies and medications reviewed and updated. History  Smoking Status  . Former Smoker  . Types: Cigarettes  . Quit date: 09/20/2007  Smokeless Tobacco  . Never Used   ROS: Per HPI   Objective:    BP 135/83   Pulse 63   Temp (!) 97.5 F (36.4 C) (Oral)   Ht 5\' 5"  (1.651 m)   Wt 117 lb (53.1 kg)   LMP 01/27/2012   BMI 19.47 kg/m   Wt Readings from Last 3 Encounters:  05/03/17 117 lb (53.1 kg)  03/24/17 118 lb (53.5 kg)  01/09/17 117 lb (53.1 kg)    Gen: NAD, alert, cooperative with exam, NCAT EYES: EOMI, no conjunctival injection, or no icterus ENT:  TMs gray b/l, OP without erythema, ttp over max, frontal sinuses LYMPH: no cervical LAD CV: NRRR, normal S1/S2, no murmur, distal pulses 2+ b/l Resp: CTABL, no wheezes, normal WOB Abd: +BS, soft, NTND.  Ext: No edema, warm Neuro: Alert and oriented  Assessment & Plan:  [name] was seen today for facial pain, nasal congestion and headache.  Diagnoses and all orders for this visit:  Sinusitis, unspecified chronicity, unspecified location Cont neti pot, flonase, start below -     amoxicillin-clavulanate (AUGMENTIN) 875-125 MG tablet; Take 1 tablet by mouth 2 (two) times daily.   Follow up plan: prn Rex Krasarol Vincent, MD [facility]

## 2017-05-24 ENCOUNTER — Telehealth: Payer: Self-pay | Admitting: Pediatrics

## 2017-05-24 DIAGNOSIS — J0110 Acute frontal sinusitis, unspecified: Secondary | ICD-10-CM

## 2017-05-24 MED ORDER — LEVOFLOXACIN 500 MG PO TABS: 500.0000 mg | ORAL_TABLET | Freq: Every day | ORAL | 0 refills | Status: DC

## 2017-05-24 NOTE — Telephone Encounter (Signed)
Spoke with pt. Took 10 days of augmentin. Symptoms improved. Had clear sinus drainage. No fevers. Three days ago started having more color to nasal discharge, temp has been up to high 98s, went to bed early last night because feeling achy. No dental pain, minimal sinus pressure now. D/w pt, likley virus. Will wait to start antibiotics unti. After at least 7 days of symptoms to see if improves. Cont antihistamine, nasal steroid, regular sinus rinses, ibuprofen, tylenol.

## 2017-05-24 NOTE — Telephone Encounter (Signed)
What symptoms do you have? Phlegm is brownish green and sinus pressure throat is hurting  How long have you been sick? Pt came in three weeks ago got 3 days worth of augmentin and three days ago started to have symptoms would like to try a zpak this time  Have you been seen for this problem? Yes three weeks ago  If your provider decides to give you a prescription, which pharmacy would you like for it to be sent to? [facility]   Patient informed that this information will be sent to the clinical staff for review and that they should receive a follow up call.

## 2017-06-15 ENCOUNTER — Ambulatory Visit (INDEPENDENT_AMBULATORY_CARE_PROVIDER_SITE_OTHER): Payer: BC Managed Care – PPO | Admitting: Family

## 2017-06-15 ENCOUNTER — Encounter: Payer: Self-pay | Admitting: Family

## 2017-06-15 VITALS — BP 122/76 | HR 57 | Temp 98.8°F | Ht 65.0 in | Wt 119.0 lb

## 2017-06-15 DIAGNOSIS — M3500 Sicca syndrome, unspecified: Secondary | ICD-10-CM | POA: Diagnosis not present

## 2017-06-15 DIAGNOSIS — J329 Chronic sinusitis, unspecified: Secondary | ICD-10-CM

## 2017-06-15 DIAGNOSIS — R5383 Other fatigue: Secondary | ICD-10-CM | POA: Diagnosis not present

## 2017-06-15 DIAGNOSIS — R631 Polydipsia: Secondary | ICD-10-CM | POA: Diagnosis not present

## 2017-06-15 DIAGNOSIS — E039 Hypothyroidism, unspecified: Secondary | ICD-10-CM

## 2017-06-15 NOTE — Progress Notes (Signed)
   Subjective:    Patient ID: April Herrera, female    DOB: 11-Dec-1965, 51 y.o.   MRN: [id number]  HPI Pt presents to the office today with fatigue over the last 2-3 months. States she has had multiple sinus infections over theses few months. Pt has seen ENT, but has not seen an allergen specialists in "years".   She is also complaining of polydipsia that has been going on for years. She is requesting she be tested for diabetes. Complaining of constant dry eyes and see's an ophthalmologists.  PT does have hypothyroidism that is stable at this time.    Review of Systems  Constitutional: Positive for fatigue.  HENT: Positive for postnasal drip, rhinorrhea and sinus pain.   Endocrine: Positive for polydipsia.       Objective:   Physical Exam  Constitutional: She is oriented to person, place, and time. She appears well-developed and well-nourished. No distress.  HENT:  Head: Normocephalic and atraumatic.  Right Ear: External ear normal.  Left Ear: External ear normal.  Nose: Mucosal edema and rhinorrhea present.  Mouth/Throat: Oropharynx is clear and moist.  Eyes: Pupils are equal, round, and reactive to light.  Neck: Normal range of motion. Neck supple. No thyromegaly present.  Cardiovascular: Normal rate, regular rhythm, normal heart sounds and intact distal pulses.   No murmur heard. Pulmonary/Chest: Effort normal and breath sounds normal. No respiratory distress. She has no wheezes.  Abdominal: Soft. Bowel sounds are normal. She exhibits no distension. There is no tenderness.  Musculoskeletal: Normal range of motion. She exhibits no edema or tenderness.  Neurological: She is alert and oriented to person, place, and time.  Skin: Skin is warm and dry.  Psychiatric: She has a normal mood and affect. Her behavior is normal. Judgment and thought content normal.  Vitals reviewed.     BP 122/76   Pulse (!) 57   Temp 98.8 F (37.1 C) (Oral)   Ht '5\' 5"'$  (1.651 m)   Wt 119 lb  (54 kg)   LMP 01/27/2012   BMI 19.80 kg/m      Assessment & Plan:  1. Sjogren's syndrome, with unspecified organ involvement ([place]) Continue eye drops Chewing gum - CMP14+EGFR  2. Fatigue, unspecified type Labs pending Rest when possible - CMP14+EGFR - Anemia Profile B - TSH - VITAMIN D 25 Hydroxy (Vit-D Deficiency, Fractures)  3. Polydipsia - CMP14+EGFR  4. Hypothyroidism, unspecified type - CMP14+EGFR - TSH  5. Recurrent sinusitis Will do referral to allergen Continue Flonase - Ambulatory referral to Allergy    Evelina Dun, FNP

## 2017-06-15 NOTE — Patient Instructions (Signed)
Sjogren Syndrome Sjogren syndrome is a disease in which the body's disease-fighting system (immune system) attacks the glands that produce tears (lacrimal glands) and the glands that produce saliva (salivary glands). This makes the eyes and mouth very dry. Sjogren syndrome is a long-term (chronic) disorder that has no cure. In some cases, it is linked to other disorders (rheumatic disorders), such as rheumatoid arthritis and systemic lupus erythematosus (SLE). It may affect other parts of the body, such as:  Kidneys.  Blood vessels.  Joints.  Lungs.  Liver.  Pancreas.  Brain.  Nerves.  Spinal cord.  What are the causes? The cause of this condition is not known. It may be passed along from parent to child (inherited), or it may be a symptom of a rheumatic disorder. What increases the risk? This condition is more likely to develop in:  Women.  People who are 45-50 years old.  People who have recently had a viral infection or currently have a viral infection.  What are the signs or symptoms? The main symptoms of this condition are:  Dry mouth. This may include: ? A chalky feeling. ? Difficulty swallowing, speaking, or tasting. ? Frequent cavities in teeth. ? Frequent mouth infections.  Dry eyes. This may include: ? Burning, redness, and itching. ? Blurry vision. ? Light sensitivity.  Other symptoms may include:  Dryness of the skin and the inside of the nose.  Eyelid infections.  Vaginal dryness, if this applies.  Joint pain and stiffness.  Muscle pain and stiffness.  How is this diagnosed? This condition is diagnosed based on:  Your symptoms.  Your medical history.  A physical exam of your eyes and mouth.  You may have tests, including:  Schirmer test. This tests your tear production.  An eye exam that is done with a magnifying device (slit-lamp exam).  An eye test that temporarily stains your eye with dye. This shows the extent of eye  damage.  Tests to check your salivary gland function.  Biopsy. This is a removal of part of a salivary gland from inside your lower lip to be studied under a microscope.  Chest X-rays.  Blood tests.  Urine tests.  How is this treated? There is no cure for this condition, but treatment can help you manage your symptoms. Treatment options may include:  Moisture replacement therapies to help relieve dryness in your skin, mouth, and eyes.  NSAIDs to help relieve pain and stiffness.  Medicines to help relieve inflammation in your body(corticosteroids). These are usually for severe cases.  Medicines to help reduce the activity of your immune system (immunosuppressants).  Surgery or insertion of plugs to close the lacrimal glands (punctal occlusion). Thishelps keep more natural tears in your eyes.  Follow these instructions at home:  Take over-the-counter and prescription medicines only as told by your health care provider.  Take these actions to care for your eyes: ? Use eye drops as told by your health care provider. ? Blink at least 5-6 times a minute. ? Protect your eyes from drafts and breezes. ? Maintain properly humidified air. You may want to use a humidifier at home. ? Avoid smoke.  Take these actions to care for your mouth: ? Brush your teeth and floss after every meal. ? Chew sugar-free gum or suck on hard candy. For some people, this can help to relieve dry mouth. ? Use antimicrobial mouthwash daily. ? Take frequent sips of water or sugar-free drinks. ? Use saliva substitutes or lip balm as told   by your health care provider.  Drink enough fluid to keep your urine clear or pale yellow.  Schedule and attend dentist visits every six months.  Keep all follow-up visits as told by your health care provider. This is important. Contact a health care provider if:  You have a fever.  You have night sweats.  You are always tired.  You have unexplained weight  loss.  You develop itchy skin.  You have red patches on your skin.  You have a lump or swelling on your neck. This information is not intended to replace advice given to you by your health care provider. Make sure you discuss any questions you have with your health care provider. Document Released: 08/26/2002 Document Revised: 05/01/2016 Document Reviewed: 05/14/2015 Elsevier Interactive Patient Education  2018 [employer]  

## 2017-06-16 LAB — ANEMIA PROFILE B
BASOS ABS: 0.1 10*3/uL (ref 0.0–0.2)
Basos: 1 %
EOS (ABSOLUTE): 0.4 10*3/uL (ref 0.0–0.4)
Eos: 4 %
FOLATE: 13.9 ng/mL (ref >3.0)
Ferritin: 30 ng/mL (ref 15–150)
Hematocrit: 41.9 % (ref 34.0–46.6)
Hemoglobin: 13.6 g/dL (ref 11.1–15.9)
IMMATURE GRANULOCYTES: 0 %
IRON: 121 ug/dL (ref 27–159)
Immature Grans (Abs): 0 10*3/uL (ref 0.0–0.1)
Iron Saturation: 35 % (ref 15–55)
LYMPHS ABS: 3.7 10*3/uL — AB (ref 0.7–3.1)
Lymphs: 40 %
MCH: 30.8 pg (ref 26.6–33.0)
MCHC: 32.5 g/dL (ref 31.5–35.7)
MCV: 95 fL (ref 79–97)
MONOCYTES: 9 %
Monocytes Absolute: 0.8 10*3/uL (ref 0.1–0.9)
NEUTROS PCT: 46 %
Neutrophils Absolute: 4.3 10*3/uL (ref 1.4–7.0)
PLATELETS: 290 10*3/uL (ref 150–379)
RBC: 4.42 x10E6/uL (ref 3.77–5.28)
RDW: 12.7 % (ref 12.3–15.4)
Retic Ct Pct: 0.9 % (ref 0.6–2.6)
TIBC: 342 ug/dL (ref 250–450)
UIBC: 221 ug/dL (ref 131–425)
Vitamin B-12: 746 pg/mL (ref 232–1245)
WBC: 9.2 10*3/uL (ref 3.4–10.8)

## 2017-06-16 LAB — VITAMIN D 25 HYDROXY (VIT D DEFICIENCY, FRACTURES): VIT D 25 HYDROXY: 38.8 ng/mL (ref 30.0–100.0)

## 2017-06-16 LAB — CMP14+EGFR
A/G RATIO: 1.9 (ref 1.2–2.2)
ALBUMIN: 4.5 g/dL (ref 3.5–5.5)
ALT: 13 IU/L (ref 0–32)
AST: 24 IU/L (ref 0–40)
Alkaline Phosphatase: 63 IU/L (ref 39–117)
BILIRUBIN TOTAL: 0.4 mg/dL (ref 0.0–1.2)
BUN / CREAT RATIO: 17 (ref 9–23)
BUN: 14 mg/dL (ref 6–24)
CHLORIDE: 97 mmol/L (ref 96–106)
CO2: 25 mmol/L (ref 20–29)
Calcium: 9.5 mg/dL (ref 8.7–10.2)
Creatinine, Ser: 0.81 mg/dL (ref 0.57–1.00)
GFR calc Af Amer: 96 mL/min/{1.73_m2} (ref >59)
GFR calc non Af Amer: 83 mL/min/{1.73_m2} (ref >59)
GLOBULIN, TOTAL: 2.4 g/dL (ref 1.5–4.5)
Glucose: 86 mg/dL (ref 65–99)
POTASSIUM: 4.5 mmol/L (ref 3.5–5.2)
SODIUM: 136 mmol/L (ref 134–144)
Total Protein: 6.9 g/dL (ref 6.0–8.5)

## 2017-06-16 LAB — THYROID PANEL WITH TSH
FREE THYROXINE INDEX: 2.5 (ref 1.2–4.9)
T3 Uptake Ratio: 27 % (ref 24–39)
T4, Total: 9.3 ug/dL (ref 4.5–12.0)
TSH: 0.722 u[IU]/mL (ref 0.450–4.500)

## 2017-06-27 ENCOUNTER — Telehealth: Payer: Self-pay | Admitting: Family Medicine

## 2017-06-27 NOTE — Telephone Encounter (Signed)
Pt. Needs to be seen for this. Thanks, WS 

## 2017-06-28 NOTE — Telephone Encounter (Signed)
It looks like she saw Christy on 06/15/17 for this. Will route to [place] to see if she will adjust.

## 2017-06-28 NOTE — Telephone Encounter (Signed)
On 06/15/17 pt's Thyroid levels were normal. Pt will need to be seen if she is not feeling good.

## 2017-06-29 NOTE — Telephone Encounter (Signed)
Patient aware and appointment scheduled with Dr. Darlyn Read for tomorrow.

## 2017-06-30 ENCOUNTER — Ambulatory Visit: Payer: BC Managed Care – PPO | Admitting: Family Medicine

## 2017-06-30 ENCOUNTER — Telehealth: Payer: Self-pay | Admitting: *Deleted

## 2017-06-30 MED ORDER — LEVOTHYROXINE SODIUM 75 MCG PO TABS: 75.0000 ug | ORAL_TABLET | Freq: Every day | ORAL | 0 refills | Status: DC

## 2017-06-30 NOTE — Telephone Encounter (Signed)
Please send in the scrip as requested. Thanks, WS

## 2017-06-30 NOTE — Telephone Encounter (Signed)
Scrip sent in per Dr Darlyn Read note and called patient and left detailed message on pt VM stating requested prescription sent to pharmacy and to call back to schedule her follow up appt.
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
## 2017-07-25 ENCOUNTER — Ambulatory Visit: Payer: BC Managed Care – PPO | Admitting: Allergy and Immunology

## 2017-11-06 ENCOUNTER — Ambulatory Visit: Payer: BC Managed Care – PPO | Admitting: Family Medicine

## 2017-11-06 ENCOUNTER — Encounter: Payer: Self-pay | Admitting: Family Medicine

## 2017-11-06 VITALS — BP 110/68 | HR 68 | Temp 98.4°F | Ht 65.0 in | Wt 119.6 lb

## 2017-11-06 DIAGNOSIS — J0140 Acute pansinusitis, unspecified: Secondary | ICD-10-CM | POA: Diagnosis not present

## 2017-11-06 MED ORDER — AMOXICILLIN-POT CLAVULANATE 875-125 MG PO TABS: 1.0000 | ORAL_TABLET | Freq: Two times a day (BID) | ORAL | 0 refills | Status: DC

## 2017-11-06 NOTE — Progress Notes (Signed)
Subjective: CC: ?sinus infection PCP: Mechele Claude, MD WUJ:[id number] April Herrera is a 52 y.o. female presenting to clinic today for:  1. Sinus symptoms  Patient reports facial pain, sinus pressure and fullness that started a week and a half ago.  She notes a dark green and brown nasal discharge.  She notes that she actually developed fever to 101 F yesterday.  She has had frequent sinus infections, at least once per year.  She has been using a Nettie pot, airborne, Tylenol, rest and increasing fluid intake in efforts to relieve symptoms.  She reports multiple sick contacts at school who have had influenza and strep throat, she is a second grade teacher.  Denies cough, hemoptysis, congestion, SOB, dizziness, rash, nausea, vomiting, diarrhea, fevers, chills, myalgia, recent travel.  No history of COPD or asthma.  No tobacco use/ exposure.    ROS: Per HPI  Allergies  Allergen Reactions  . Sulfa Antibiotics    Past Medical History:  Diagnosis Date  . Hypotension   . Osteopenia   . Thyroid disease    hypothyroidism    Current Outpatient Medications:  .  Calcium Citrate-Vitamin D (CALCIUM + D PO), Take by mouth., Disp: , Rfl:  .  Fish Oil-Cholecalciferol (FISH OIL + D3 PO), Take by mouth., Disp: , Rfl:  .  fluticasone (FLONASE) 50 MCG/ACT nasal spray, Use 2 sprays in each nostril once daily, Disp: 16 g, Rfl: 4 .  levothyroxine (SYNTHROID, LEVOTHROID) 75 MCG tablet, Take 1 tablet (75 mcg total) by mouth daily., Disp: 90 tablet, Rfl: 0 .  Multiple Vitamins-Minerals (MULTI COMPLETE PO), Take by mouth., Disp: , Rfl:  .  RESTASIS MULTIDOSE 0.05 % ophthalmic emulsion, Instill 1 drop in each eye 2 times a day, Disp: , Rfl: 3 .  XIIDRA 5 % SOLN, place 1 drop into each eye twice daily, Disp: , Rfl: 3 Social History   Socioeconomic History  . Marital status: Married    Spouse name: Not on file  . Number of children: Not on file  . Years of education: Not on file  . Highest education  level: Not on file  Social Needs  . Financial resource strain: Not on file  . Food insecurity - worry: Not on file  . Food insecurity - inability: Not on file  . Transportation needs - medical: Not on file  . Transportation needs - non-medical: Not on file  Occupational History  . Not on file  Tobacco Use  . Smoking status: Former Smoker    Types: Cigarettes    Last attempt to quit: 09/20/2007    Years since quitting: 10.1  . Smokeless tobacco: Never Used  Substance and Sexual Activity  . Alcohol use: Yes    Alcohol/week: 1.8 oz    Types: 3 Cans of beer per week  . Drug use: No  . Sexual activity: Not on file  Other Topics Concern  . Not on file  Social History Narrative  . Not on file   Family History  Problem Relation Age of Onset  . Cancer Mother        breast  . Cancer Father        colon cancer    Objective: Office vital signs reviewed. BP 110/68   Pulse 68   Temp 98.4 F (36.9 C) (Oral)   Ht 5\' 5"  (1.651 m)   Wt 119 lb 9.6 oz (54.3 kg)   LMP 01/27/2012   BMI 19.90 kg/m   Physical Examination:  General:  Awake, alert, nontoxic appearing female, No acute distress HEENT: Normal    Neck: No masses palpated.  Mild enlargement of the right anterior cervical lymph node.    Ears: Tympanic membranes intact, normal light reflex, no erythema, no bulging    Eyes: PERRLA, extraocular membranes intact, sclera white, no ocular discharge    Nose: nasal turbinates moist, mildly opaque nasal discharge    Throat: moist mucus membranes, moderate oropharyngeal erythema, no tonsillar exudate.  Airway is patent Cardio: regular rate and rhythm, S1S2 heard, no murmurs appreciated Pulm: clear to auscultation bilaterally, no wheezes, rhonchi or rales; normal work of breathing on room air  Assessment/ Plan: 52 y.o. female   1. Acute non-recurrent pansinusitis Patient with both frontal and maxillary sinus tenderness.  She is afebrile with normal vital signs.  She is nontoxic.   Doubt strep or flu.  Physical exam was notable for mild enlargement of the right anterior cervical lymph node and moderate oropharyngeal erythema.  Augmentin 875 p.o. twice daily for 10 days prescribed.  Continue home care as she has been doing.  Push oral fluids. Strict return precautions and reasons for emergent evaluation in the emergency department review with patient.  They voiced understanding and will follow-up as needed.  Meds ordered this encounter  Medications  . amoxicillin-clavulanate (AUGMENTIN) 875-125 MG tablet    Sig: Take 1 tablet by mouth 2 (two) times daily.    Dispense:  20 tablet    Refill:  0     Petra Dumler Hulen SkainsM Jennilee Demarco, DO [facility] [phone]

## 2018-01-03 ENCOUNTER — Other Ambulatory Visit: Payer: Self-pay | Admitting: Family Medicine

## 2018-01-09 ENCOUNTER — Ambulatory Visit: Payer: BC Managed Care – PPO | Admitting: Family Medicine

## 2018-01-09 ENCOUNTER — Encounter: Payer: Self-pay | Admitting: Family Medicine

## 2018-01-09 VITALS — BP 110/75 | HR 61 | Temp 97.4°F | Ht 65.0 in | Wt 121.4 lb

## 2018-01-09 DIAGNOSIS — E039 Hypothyroidism, unspecified: Secondary | ICD-10-CM | POA: Diagnosis not present

## 2018-01-09 LAB — URINALYSIS
BILIRUBIN UA: NEGATIVE
GLUCOSE, UA: NEGATIVE
KETONES UA: NEGATIVE
Leukocytes, UA: NEGATIVE
Nitrite, UA: NEGATIVE
PROTEIN UA: NEGATIVE
RBC UA: NEGATIVE
SPEC GRAV UA: 1.015 (ref 1.005–1.030)
UUROB: 0.2 mg/dL (ref 0.2–1.0)
pH, UA: 7.5 (ref 5.0–7.5)

## 2018-01-09 NOTE — Progress Notes (Signed)
Subjective:  Patient ID: April Herrera, female    DOB: 07-30-1966  Age: 52 y.o. MRN: [id number]  CC: Hypothyroidism (pt here today for f/u on her thyroid)   HPI April Herrera presents for patient presents for follow-up on  thyroid. The patient has a history of hypothyroidism for many years. It has been stable recently. Pt. denies any change in  voice, loss of hair, heat or cold intolerance. Energy level has been adequate.  Over the last week or 2 she has been off work and feels like she is been more tired than usual.  Patient denies constipation and diarrhea. No myxedema. Medication is as noted below. Verified that pt is taking it daily on an empty stomach. Well tolerated.  Depression screen [facility] 2/9 11/06/2017 06/15/2017 05/03/2017  Decreased Interest 0 0 0  Down, Depressed, Hopeless 0 0 0  PHQ - 2 Score 0 0 0    History April Herrera has a past medical history of Hypotension, Osteopenia, and Thyroid disease.   She has no past surgical history on file.   Her family history includes Cancer in her father and mother.She reports that she quit smoking about 10 years ago. Her smoking use included cigarettes. She has never used smokeless tobacco. She reports that she drinks about 1.8 oz of alcohol per week. She reports that she does not use drugs.    ROS Review of Systems  Constitutional: Negative.   HENT: Negative.  Negative for congestion.   Eyes: Negative for visual disturbance.  Respiratory: Negative for shortness of breath.   Cardiovascular: Negative for chest pain.  Gastrointestinal: Negative for abdominal pain, constipation, diarrhea, nausea and vomiting.  Genitourinary: Negative for difficulty urinating.  Musculoskeletal: Negative for arthralgias and myalgias.  Neurological: Negative for headaches.  Psychiatric/Behavioral: Negative for sleep disturbance.    Objective:  BP 110/75   Pulse 61   Temp (!) 97.4 F (36.3 C) (Oral)   Ht '5\' 5"'  (1.651 m)   Wt 121 lb 6 oz (55.1  kg)   LMP 01/27/2012   BMI 20.20 kg/m   BP Readings from Last 3 Encounters:  01/09/18 110/75  11/06/17 110/68  06/15/17 122/76    Wt Readings from Last 3 Encounters:  01/09/18 121 lb 6 oz (55.1 kg)  11/06/17 119 lb 9.6 oz (54.3 kg)  06/15/17 119 lb (54 kg)     Physical Exam  Constitutional: She is oriented to person, place, and time. She appears well-developed and well-nourished. No distress.  HENT:  Head: Normocephalic and atraumatic.  Right Ear: External ear normal.  Left Ear: External ear normal.  Nose: Nose normal.  Mouth/Throat: Oropharynx is clear and moist.  Eyes: Pupils are equal, round, and reactive to light. Conjunctivae and EOM are normal.  Neck: Normal range of motion. Neck supple. No thyromegaly present.  Cardiovascular: Normal rate, regular rhythm and normal heart sounds.  No murmur heard. Pulmonary/Chest: Effort normal and breath sounds normal. No respiratory distress. She has no wheezes. She has no rales.  Abdominal: Soft. Bowel sounds are normal. She exhibits no distension. There is no tenderness.  Lymphadenopathy:    She has no cervical adenopathy.  Neurological: She is alert and oriented to person, place, and time. She has normal reflexes.  Skin: Skin is warm and dry.  Psychiatric: She has a normal mood and affect. Her behavior is normal. Judgment and thought content normal.      Assessment & Plan:   April Herrera was seen today for hypothyroidism.  Diagnoses and all  orders for this visit:  Hypothyroidism, unspecified type -     CBC with Differential/Platelet -     CMP14+EGFR -     TSH -     T4, Free -     T3, Free -     Urinalysis       I have discontinued April Herrera's amoxicillin-clavulanate. I am also having her maintain her Fish Oil-Cholecalciferol (FISH OIL + D3 PO), Calcium Citrate-Vitamin D (CALCIUM + D PO), Multiple Vitamins-Minerals (MULTI COMPLETE PO), fluticasone, XIIDRA, RESTASIS MULTIDOSE, and levothyroxine.  Allergies  as of 01/09/2018      Reactions   Sulfa Antibiotics       Medication List        Accurate as of 01/09/18 12:34 PM. Always use your most recent med list.          CALCIUM + D PO Take by mouth.   FISH OIL + D3 PO Take by mouth.   fluticasone 50 MCG/ACT nasal spray Commonly known as:  FLONASE Use 2 sprays in each nostril once daily   levothyroxine 75 MCG tablet Commonly known as:  SYNTHROID, LEVOTHROID TAKE 1 TABLET BY MOUTH ONCE DAILY BEFORE BREAKFAST   MULTI COMPLETE PO Take by mouth.   RESTASIS MULTIDOSE 0.05 % ophthalmic emulsion Generic drug:  cycloSPORINE Instill 1 drop in each eye 2 times a day   XIIDRA 5 % Soln Generic drug:  Lifitegrast place 1 drop into each eye twice daily        Follow-up: Return in about 6 months (around 07/11/2018) for Wellness.  Claretta Fraise, M.D.

## 2018-01-10 ENCOUNTER — Telehealth: Payer: Self-pay | Admitting: Family Medicine

## 2018-01-10 ENCOUNTER — Other Ambulatory Visit: Payer: Self-pay | Admitting: Family Medicine

## 2018-01-10 LAB — CBC WITH DIFFERENTIAL/PLATELET
Basophils Absolute: 0 10*3/uL (ref 0.0–0.2)
Basos: 1 %
EOS (ABSOLUTE): 0.6 10*3/uL — ABNORMAL HIGH (ref 0.0–0.4)
EOS: 7 %
HEMATOCRIT: 44 % (ref 34.0–46.6)
HEMOGLOBIN: 14.3 g/dL (ref 11.1–15.9)
IMMATURE GRANULOCYTES: 0 %
Immature Grans (Abs): 0 10*3/uL (ref 0.0–0.1)
Lymphocytes Absolute: 3 10*3/uL (ref 0.7–3.1)
Lymphs: 34 %
MCH: 31.2 pg (ref 26.6–33.0)
MCHC: 32.5 g/dL (ref 31.5–35.7)
MCV: 96 fL (ref 79–97)
MONOCYTES: 7 %
MONOS ABS: 0.6 10*3/uL (ref 0.1–0.9)
NEUTROS PCT: 51 %
Neutrophils Absolute: 4.5 10*3/uL (ref 1.4–7.0)
Platelets: 294 10*3/uL (ref 150–379)
RBC: 4.59 x10E6/uL (ref 3.77–5.28)
RDW: 13.1 % (ref 12.3–15.4)
WBC: 8.8 10*3/uL (ref 3.4–10.8)

## 2018-01-10 LAB — CMP14+EGFR
ALK PHOS: 74 IU/L (ref 39–117)
ALT: 11 IU/L (ref 0–32)
AST: 21 IU/L (ref 0–40)
Albumin/Globulin Ratio: 1.8 (ref 1.2–2.2)
Albumin: 4.5 g/dL (ref 3.5–5.5)
BUN/Creatinine Ratio: 12 (ref 9–23)
BUN: 9 mg/dL (ref 6–24)
Bilirubin Total: 0.7 mg/dL (ref 0.0–1.2)
CALCIUM: 10.1 mg/dL (ref 8.7–10.2)
CO2: 27 mmol/L (ref 20–29)
CREATININE: 0.76 mg/dL (ref 0.57–1.00)
Chloride: 98 mmol/L (ref 96–106)
GFR calc Af Amer: 104 mL/min/{1.73_m2} (ref >59)
GFR, EST NON AFRICAN AMERICAN: 90 mL/min/{1.73_m2} (ref >59)
GLUCOSE: 80 mg/dL (ref 65–99)
Globulin, Total: 2.5 g/dL (ref 1.5–4.5)
Potassium: 4.7 mmol/L (ref 3.5–5.2)
Sodium: 139 mmol/L (ref 134–144)
Total Protein: 7 g/dL (ref 6.0–8.5)

## 2018-01-10 LAB — T4, FREE: FREE T4: 1.34 ng/dL (ref 0.82–1.77)

## 2018-01-10 LAB — TSH: TSH: 3.95 u[IU]/mL (ref 0.450–4.500)

## 2018-01-10 LAB — T3, FREE: T3 FREE: 2.7 pg/mL (ref 2.0–4.4)

## 2018-01-10 MED ORDER — LEVOTHYROXINE SODIUM 88 MCG PO TABS: 88.0000 ug | ORAL_TABLET | Freq: Every day | ORAL | 1 refills | Status: DC

## 2018-01-10 NOTE — Telephone Encounter (Signed)
Patient aware and states she would like to come in in a month for a lab only visit to check her TSH. Please place of approved.

## 2018-01-10 NOTE — Telephone Encounter (Signed)
She should be seen since it involves a dose change. I recommend giving it 6-8 weeks. Then come in for lab. Then see me 2 days later.

## 2018-01-10 NOTE — Telephone Encounter (Signed)
Patient aware and verbalizes understanding. 

## 2018-01-10 NOTE — Telephone Encounter (Signed)
Please contact the patient let her know it is done

## 2018-01-31 ENCOUNTER — Telehealth: Payer: Self-pay | Admitting: Family Medicine

## 2018-01-31 ENCOUNTER — Other Ambulatory Visit: Payer: Self-pay | Admitting: Family Medicine

## 2018-01-31 DIAGNOSIS — E039 Hypothyroidism, unspecified: Secondary | ICD-10-CM

## 2018-01-31 NOTE — Telephone Encounter (Signed)
Patient had thyroid test in April.  Please advise if needs repeating.

## 2018-02-13 ENCOUNTER — Other Ambulatory Visit: Payer: BC Managed Care – PPO

## 2018-02-13 DIAGNOSIS — E039 Hypothyroidism, unspecified: Secondary | ICD-10-CM

## 2018-02-14 LAB — TSH+FREE T4
Free T4: 1.31 ng/dL (ref 0.82–1.77)
TSH: 3.58 u[IU]/mL (ref 0.450–4.500)

## 2018-02-14 LAB — T3, FREE: T3, Free: 2.7 pg/mL (ref 2.0–4.4)

## 2018-02-20 ENCOUNTER — Telehealth: Payer: Self-pay | Admitting: Family Medicine

## 2018-02-20 NOTE — Telephone Encounter (Signed)
Gave call to triage °

## 2018-02-21 ENCOUNTER — Encounter: Payer: Self-pay | Admitting: Family Medicine

## 2018-02-21 ENCOUNTER — Ambulatory Visit: Payer: BC Managed Care – PPO | Admitting: Family Medicine

## 2018-02-21 VITALS — BP 112/74 | HR 63 | Temp 97.7°F | Ht 65.0 in | Wt 120.1 lb

## 2018-02-21 DIAGNOSIS — E039 Hypothyroidism, unspecified: Secondary | ICD-10-CM | POA: Diagnosis not present

## 2018-02-21 MED ORDER — LEVOTHYROXINE SODIUM 100 MCG PO TABS: 100.0000 ug | ORAL_TABLET | Freq: Every day | ORAL | 3 refills | Status: DC

## 2018-03-04 ENCOUNTER — Encounter: Payer: Self-pay | Admitting: Family Medicine

## 2018-03-04 NOTE — Progress Notes (Signed)
Subjective:  Patient ID: April Herrera, female    DOB: Feb 11, 1966  Age: 52 y.o. MRN: [id number]  CC: Medical Management of Chronic Issues   HPI April Herrera presents for patient presents for follow-up on  thyroid. The patient has a history of hypothyroidism for many years. It has been stable recently. Pt. denies any change in  voice, loss of hair, heat or cold intolerance. Energy level has been adequate to good. Patient denies constipation and diarrhea. No myxedema. Medication is as noted below. Verified that pt is taking it daily on an empty stomach. Well tolerated.  Depression screen [facility] 2/9 02/21/2018 11/06/2017 06/15/2017  Decreased Interest 0 0 0  Down, Depressed, Hopeless 0 0 0  PHQ - 2 Score 0 0 0    History April Herrera has a past medical history of Hypotension, Osteopenia, and Thyroid disease.   She has no past surgical history on file.   Her family history includes Cancer in her father and mother.She reports that she quit smoking about 10 years ago. Her smoking use included cigarettes. She has never used smokeless tobacco. She reports that she drinks about 1.8 oz of alcohol per week. She reports that she does not use drugs.    ROS Review of Systems  Constitutional: Negative.   HENT: Negative.   Eyes: Negative for visual disturbance.  Respiratory: Negative for shortness of breath.   Cardiovascular: Negative for chest pain.  Gastrointestinal: Negative for abdominal pain.  Musculoskeletal: Negative for arthralgias.    Objective:  BP 112/74   Pulse 63   Temp 97.7 F (36.5 C) (Oral)   Ht _0  (1.651 m)   Wt 120 lb 2 oz (54.5 kg)   LMP 01/27/2012   BMI 19.99 kg/m   BP Readings from Last 3 Encounters:  02/21/18 112/74  01/09/18 110/75  11/06/17 110/68    Wt Readings from Last 3 Encounters:  02/21/18 120 lb 2 oz (54.5 kg)  01/09/18 121 lb 6 oz (55.1 kg)  11/06/17 119 lb 9.6 oz (54.3 kg)     Physical Exam  Constitutional: She is oriented to person,  place, and time. She appears well-developed and well-nourished. No distress.  Cardiovascular: Normal rate and regular rhythm.  Pulmonary/Chest: Breath sounds normal.  Neurological: She is alert and oriented to person, place, and time.  Skin: Skin is warm and dry.  Psychiatric: She has a normal mood and affect.      Assessment & Plan:   April Herrera was seen today for medical management of chronic issues.  Diagnoses and all orders for this visit:  Hypothyroidism, unspecified type -     CMP14+EGFR; Future -     TSH; Future -     T4, Free; Future -     T3, Free; Future  Other orders -     levothyroxine (SYNTHROID, LEVOTHROID) 100 MCG tablet; Take 1 tablet (100 mcg total) by mouth daily.       I have discontinued April Herrera's levothyroxine. I am also having her start on levothyroxine. Additionally, I am having her maintain her Fish Oil-Cholecalciferol (FISH OIL + D3 PO), Calcium Citrate-Vitamin D (CALCIUM + D PO), Multiple Vitamins-Minerals (MULTI COMPLETE PO), fluticasone, XIIDRA, and RESTASIS MULTIDOSE.  Allergies as of 02/21/2018      Reactions   Sulfa Antibiotics       Medication List        Accurate as of 02/21/18 11:59 PM. Always use your most recent med list.  CALCIUM + D PO Take by mouth.   FISH OIL + D3 PO Take by mouth.   fluticasone 50 MCG/ACT nasal spray Commonly known as:  FLONASE Use 2 sprays in each nostril once daily   levothyroxine 100 MCG tablet Commonly known as:  SYNTHROID, LEVOTHROID Take 1 tablet (100 mcg total) by mouth daily.   MULTI COMPLETE PO Take by mouth.   RESTASIS MULTIDOSE 0.05 % ophthalmic emulsion Generic drug:  cycloSPORINE Instill 1 drop in each eye 2 times a day   XIIDRA 5 % Soln Generic drug:  Lifitegrast place 1 drop into each eye twice daily        Follow-up: Return in about 6 months (around 08/23/2018).  Claretta Fraise, M.D.

## 2018-03-07 ENCOUNTER — Telehealth: Payer: Self-pay | Admitting: Family Medicine

## 2018-03-07 ENCOUNTER — Other Ambulatory Visit: Payer: BC Managed Care – PPO

## 2018-03-07 DIAGNOSIS — E039 Hypothyroidism, unspecified: Secondary | ICD-10-CM

## 2018-03-07 NOTE — Telephone Encounter (Signed)
Future orders already in Epic and pt is aware.

## 2018-03-08 LAB — CMP14+EGFR
A/G RATIO: 1.9 (ref 1.2–2.2)
ALT: 8 IU/L (ref 0–32)
AST: 19 IU/L (ref 0–40)
Albumin: 4.5 g/dL (ref 3.5–5.5)
Alkaline Phosphatase: 72 IU/L (ref 39–117)
BILIRUBIN TOTAL: 0.3 mg/dL (ref 0.0–1.2)
BUN / CREAT RATIO: 13 (ref 9–23)
BUN: 12 mg/dL (ref 6–24)
CO2: 26 mmol/L (ref 20–29)
Calcium: 9.6 mg/dL (ref 8.7–10.2)
Chloride: 96 mmol/L (ref 96–106)
Creatinine, Ser: 0.89 mg/dL (ref 0.57–1.00)
GFR calc non Af Amer: 74 mL/min/{1.73_m2} (ref >59)
GFR, EST AFRICAN AMERICAN: 86 mL/min/{1.73_m2} (ref >59)
GLOBULIN, TOTAL: 2.4 g/dL (ref 1.5–4.5)
GLUCOSE: 78 mg/dL (ref 65–99)
POTASSIUM: 4.4 mmol/L (ref 3.5–5.2)
SODIUM: 137 mmol/L (ref 134–144)
TOTAL PROTEIN: 6.9 g/dL (ref 6.0–8.5)

## 2018-03-08 LAB — TSH: TSH: 0.441 u[IU]/mL — ABNORMAL LOW (ref 0.450–4.500)

## 2018-03-08 LAB — T4, FREE: Free T4: 1.61 ng/dL (ref 0.82–1.77)

## 2018-03-08 LAB — T3, FREE: T3 FREE: 3.1 pg/mL (ref 2.0–4.4)

## 2018-03-12 ENCOUNTER — Other Ambulatory Visit: Payer: Self-pay | Admitting: Family Medicine

## 2018-03-12 ENCOUNTER — Telehealth: Payer: Self-pay | Admitting: Family Medicine

## 2018-03-12 DIAGNOSIS — E039 Hypothyroidism, unspecified: Secondary | ICD-10-CM

## 2018-03-12 NOTE — Telephone Encounter (Signed)
Patient is waiting on results of labwork.  Please advise.

## 2018-04-02 ENCOUNTER — Encounter: Payer: Self-pay | Admitting: Pediatrics

## 2018-04-02 ENCOUNTER — Ambulatory Visit: Payer: BC Managed Care – PPO | Admitting: Pediatrics

## 2018-04-02 VITALS — BP 125/79 | HR 61 | Temp 98.1°F | Ht 65.0 in | Wt 122.6 lb

## 2018-04-02 DIAGNOSIS — J0100 Acute maxillary sinusitis, unspecified: Secondary | ICD-10-CM

## 2018-04-02 DIAGNOSIS — E039 Hypothyroidism, unspecified: Secondary | ICD-10-CM

## 2018-04-02 MED ORDER — AMOXICILLIN-POT CLAVULANATE 875-125 MG PO TABS: 1.0000 | ORAL_TABLET | Freq: Two times a day (BID) | ORAL | 0 refills | Status: DC

## 2018-04-02 NOTE — Progress Notes (Signed)
  Subjective:   Patient ID: April Herrera, female    DOB: 09/25/1965, 52 y.o.   MRN: [id number] CC: Nasal Congestion; Sore Throat; and Ear Pain  HPI: April Herrera is a 52 y.o. female   For the last month she has had nasal congestion, worsened for the last few days.  She is been using a Neti pot regularly.  She has a lot of pain of her eyebrows and below her eyes.  Low-grade temperatures up to 99 daily for the last month.  Appetite has been okay.  Not taking any antihistamines because of her Sjogren's disease, worsens dryness.  Relevant past medical, surgical, family and social history reviewed. Allergies and medications reviewed and updated. Social History   Tobacco Use  Smoking Status Former Smoker  . Types: Cigarettes  . Last attempt to quit: 09/20/2007  . Years since quitting: 10.5  Smokeless Tobacco Never Used   ROS: Per HPI   Objective:    BP 125/79   Pulse 61   Temp 98.1 F (36.7 C) (Oral)   Ht 5\' 5"  (1.651 m)   Wt 122 lb 9.6 oz (55.6 kg)   LMP 01/27/2012   BMI 20.40 kg/m   Wt Readings from Last 3 Encounters:  04/02/18 122 lb 9.6 oz (55.6 kg)  02/21/18 120 lb 2 oz (54.5 kg)  01/09/18 121 lb 6 oz (55.1 kg)   Gen: NAD, alert, cooperative with exam, NCAT EYES: EOMI, no conjunctival injection, or no icterus ENT:  TMs dull gray with layering yellow fluid b/l, OP with erythema.  Tender to palpation over frontal sinuses bilaterally and maxillary sinuses bilaterally LYMPH: no cervical LAD CV: NRRR, normal S1/S2, no murmur, distal pulses 2+ b/l Resp: CTABL, no wheezes, normal WOB Abd: +BS, soft, NTND. no guarding or organomegaly Ext: No edema, warm Neuro: Alert and oriented, strength equal b/l UE and LE, coordination grossly normal MSK: normal muscle bulk  Assessment & Plan:  April Herrera was seen today for nasal congestion, sore throat and ear pain.  Diagnoses and all orders for this visit:  Subacute maxillary sinusitis Continue sinus rinses, start below. -      amoxicillin-clavulanate (AUGMENTIN) 875-125 MG tablet; Take 1 tablet by mouth 2 (two) times daily.  Hypothyroidism, unspecified type -     T3, free -     TSH + free T4   Follow up plan: Return if symptoms worsen or fail to improve. Rex Krasarol Epimenio Schetter, MD [facility]

## 2018-04-04 ENCOUNTER — Other Ambulatory Visit: Payer: Self-pay | Admitting: Family Medicine

## 2018-04-04 DIAGNOSIS — E039 Hypothyroidism, unspecified: Secondary | ICD-10-CM

## 2018-04-04 LAB — TSH+FREE T4
Free T4: 1.71 ng/dL (ref 0.82–1.77)
TSH: 0.245 u[IU]/mL — AB (ref 0.450–4.500)

## 2018-04-04 LAB — T3, FREE: T3, Free: 3.3 pg/mL (ref 2.0–4.4)

## 2018-04-11 ENCOUNTER — Ambulatory Visit: Payer: BC Managed Care – PPO | Admitting: Pediatrics

## 2018-04-11 ENCOUNTER — Encounter: Payer: Self-pay | Admitting: Pediatrics

## 2018-04-11 VITALS — BP 128/81 | HR 62 | Temp 97.5°F | Ht 65.0 in | Wt 120.6 lb

## 2018-04-11 DIAGNOSIS — M3500 Sicca syndrome, unspecified: Secondary | ICD-10-CM | POA: Diagnosis not present

## 2018-04-11 DIAGNOSIS — E039 Hypothyroidism, unspecified: Secondary | ICD-10-CM | POA: Diagnosis not present

## 2018-04-11 MED ORDER — LEVOTHYROXINE SODIUM 88 MCG PO TABS: 88.0000 ug | ORAL_TABLET | Freq: Every day | ORAL | 1 refills | Status: DC

## 2018-04-11 NOTE — Progress Notes (Signed)
  Subjective:   Patient ID: April Herrera, female    DOB: 10/28/1965, 52 y.o.   MRN: [id number] CC: Establish Care And follow-up thyroid problems. HPI: April Herrera is a 52 y.o. female   Patient was seen 2 weeks ago and treated for sinusitis after 2 months of symptoms, pressure in her face, nasal congestion and runny nose.  She is not able to take antihistamines because of her dry mouth dry eyes.  H/o Hasimoto's, has been on thyroid medicine since her 3120s-30s.  She is on 75 mcg up until about a year ago when her TSH was slightly elevated over 6.  Since then she has been taking 88 mcg.  At one point it was increased to 100 mcg.  She had a blood test done within the last couple weeks that showed a slightly low TSH.  For last couple weeks she is been taking 100 mcg.  This is because she ran out of the 88 mcg.  She thinks the 100 mcg is too much, but on average.  Denies any heart palpitations.  Gluten, dairy, soy, and corn free for past few years, has helped with symptoms of bloating.  History of dry eyes, dry mouth.  Says she was told she has Sjogren's syndrome, she is never been tested with blood work.  Relevant past medical, surgical, family and social history reviewed. Allergies and medications reviewed and updated. Social History   Tobacco Use  Smoking Status Former Smoker  . Types: Cigarettes  . Last attempt to quit: 09/20/2007  . Years since quitting: 10.5  Smokeless Tobacco Never Used   ROS: Per HPI   Objective:    BP 128/81   Pulse 62   Temp (!) 97.5 F (36.4 C) (Oral)   Ht 5\' 5"  (1.651 m)   Wt 120 lb 9.6 oz (54.7 kg)   LMP 01/27/2012   BMI 20.07 kg/m   Wt Readings from Last 3 Encounters:  04/11/18 120 lb 9.6 oz (54.7 kg)  04/02/18 122 lb 9.6 oz (55.6 kg)  02/21/18 120 lb 2 oz (54.5 kg)    Gen: NAD, alert, cooperative with exam, NCAT EYES: EOMI, no conjunctival injection, or no icterus ENT:  TMs dull gray b/l, OP without erythema LYMPH: no cervical  LAD NECK: nl thyroid CV: NRRR, normal S1/S2, no murmur, distal pulses 2+ b/l Resp: CTABL, no wheezes, normal WOB Abd: +BS, soft, NTND. no guarding or organomegaly Ext: No edema, warm Neuro: Alert and oriented, strength equal b/l UE and LE, coordination grossly normal  Assessment & Plan:  April Herrera was seen today for establish care, follow up thyroid problems.  Diagnoses and all orders for this visit:  Sicca syndrome, unspecified (HCC) We will test for below.  Discussed Sjogren's syndrome.  Patient already following with eye doctor, on eyedrops. -     Sjogren's syndrome antibods(ssa + ssb) -     ANA -     CBC with Differential -     Sedimentation rate -     Hepatitis C antibody -     Rheumatoid factor  Hypothyroidism, unspecified type Pt slightly hyperthyroid. Reduce dose to 88 mcg.  Repeat TSH in 10 weeks. -     levothyroxine (SYNTHROID, LEVOTHROID) 88 MCG tablet; Take 1 tablet (88 mcg total) by mouth daily. -     TSH; Future  Follow up plan: Return in about 6 months (around 10/12/2018). Rex Krasarol Amica Harron, MD [facility]

## 2018-04-12 LAB — CBC WITH DIFFERENTIAL/PLATELET
BASOS ABS: 0 10*3/uL (ref 0.0–0.2)
Basos: 0 %
EOS (ABSOLUTE): 0.2 10*3/uL (ref 0.0–0.4)
Eos: 2 %
HEMATOCRIT: 44.1 % (ref 34.0–46.6)
Hemoglobin: 14 g/dL (ref 11.1–15.9)
IMMATURE GRANULOCYTES: 0 %
Immature Grans (Abs): 0 10*3/uL (ref 0.0–0.1)
Lymphocytes Absolute: 3.5 10*3/uL — ABNORMAL HIGH (ref 0.7–3.1)
Lymphs: 35 %
MCH: 29.9 pg (ref 26.6–33.0)
MCHC: 31.7 g/dL (ref 31.5–35.7)
MCV: 94 fL (ref 79–97)
MONOCYTES: 8 %
MONOS ABS: 0.8 10*3/uL (ref 0.1–0.9)
NEUTROS PCT: 55 %
Neutrophils Absolute: 5.6 10*3/uL (ref 1.4–7.0)
Platelets: 286 10*3/uL (ref 150–450)
RBC: 4.69 x10E6/uL (ref 3.77–5.28)
RDW: 12.6 % (ref 12.3–15.4)
WBC: 10.1 10*3/uL (ref 3.4–10.8)

## 2018-04-12 LAB — SEDIMENTATION RATE: SED RATE: 2 mm/h (ref 0–40)

## 2018-04-12 LAB — ANA: Anti Nuclear Antibody(ANA): NEGATIVE

## 2018-04-12 LAB — RHEUMATOID FACTOR

## 2018-04-12 LAB — HEPATITIS C ANTIBODY

## 2018-04-12 LAB — SJOGREN'S SYNDROME ANTIBODS(SSA + SSB): ENA SSB (LA) Ab: <0.2 AI (ref 0.0–0.9)

## 2018-04-17 ENCOUNTER — Other Ambulatory Visit: Payer: Self-pay | Admitting: Pediatrics

## 2018-04-17 DIAGNOSIS — M3500 Sicca syndrome, unspecified: Secondary | ICD-10-CM

## 2018-04-20 ENCOUNTER — Other Ambulatory Visit: Payer: Self-pay | Admitting: Nurse Practitioner

## 2018-04-20 ENCOUNTER — Encounter: Payer: Self-pay | Admitting: Pediatrics

## 2018-04-20 MED ORDER — PREDNISONE 20 MG PO TABS: ORAL_TABLET | ORAL | 0 refills | Status: DC

## 2018-04-20 NOTE — Progress Notes (Signed)
Prednisone 20 mg 2 po qd for 5 days - rx sent to pharmacy

## 2018-05-07 ENCOUNTER — Encounter: Payer: Self-pay | Admitting: Pediatrics

## 2018-05-07 MED ORDER — MONTELUKAST SODIUM 10 MG PO TABS: 10.0000 mg | ORAL_TABLET | Freq: Every day | ORAL | 5 refills | Status: DC

## 2018-05-16 ENCOUNTER — Ambulatory Visit: Payer: BC Managed Care – PPO | Admitting: "Endocrinology

## 2018-06-05 NOTE — Progress Notes (Signed)
Office Visit Note  Patient: April Herrera             Date of Birth: 04/14/1966           MRN: [id number]             PCP: Johna SheriffVincent, Carol L, MD Referring: Johna SheriffVincent, Carol L, MD Visit Date: 06/19/2018 Occupation: teacher  Subjective:  Dry mouth and dry eyes  History of Present Illness: April Herrera is a 52 y.o. female seen in consultation per request of her PCP for evaluation of dry mouth and dry eyes.  According to patient she had menopause at age 52 at that time she developed a lot of vaginal dryness.  For the last 2 to 3 years she has been experiencing dry mouth and dry eyes.  She states she tried drinking water but it is difficult for her as she is a Runner, broadcasting/film/videoteacher and she does not have access to the bathroom all the time.  She has been using over-the-counter eyedrops which did not help.  She saw her ophthalmologist who placed her on Restasis and Benay SpiceXiidra which has helped her dry eyes.  She continues to have dry mouth.  She was taking fish oil but she discontinued because of difficulty swallowing the pills.  She states she recently had a bad sinus infection for which she was given prednisone.  Some discomfort in her left thumb.  She gives history of raynaud's phenominon especially during the cold season.  Activities of Daily Living:  Patient reports morning stiffness for 0 minutes.   Patient Denies nocturnal pain.  Difficulty dressing/grooming: Denies Difficulty climbing stairs: Denies Difficulty getting out of chair: Denies Difficulty using hands for taps, buttons, cutlery, and/or writing: Denies  Review of Systems  Constitutional: Positive for fatigue.  HENT: Positive for mouth dryness. Negative for mouth sores and nose dryness.   Eyes: Positive for dryness. Negative for pain and visual disturbance.  Respiratory: Negative for cough, hemoptysis, shortness of breath and difficulty breathing.   Cardiovascular: Positive for palpitations. Negative for chest pain, hypertension and  swelling in legs/feet.  Gastrointestinal: Positive for constipation. Negative for blood in stool and diarrhea.  Endocrine: Negative for increased urination.  Genitourinary: Negative for painful urination.  Musculoskeletal: Positive for arthralgias and joint pain. Negative for joint swelling, myalgias, muscle weakness, morning stiffness, muscle tenderness and myalgias.  Skin: Positive for color change. Negative for pallor, rash, hair loss, nodules/bumps, skin tightness, ulcers and sensitivity to sunlight.  Allergic/Immunologic: Negative for susceptible to infections.  Neurological: Positive for headaches. Negative for dizziness, numbness and weakness.  Hematological: Negative for swollen glands.  Psychiatric/Behavioral: Negative for depressed mood and sleep disturbance. The patient is not nervous/anxious.     PMFS History:  Patient Active Problem List   Diagnosis Date Noted  . Hypothyroidism 04/13/2016  . Osteopenia 08/13/2014    Past Medical History:  Diagnosis Date  . Hypotension   . Osteopenia   . Thyroid disease    hypothyroidism    Family History  Problem Relation Age of Onset  . Cancer Mother        breast  . Cancer Father        colon cancer  . Hypothyroidism Sister   . Hashimoto's thyroiditis Son    Past Surgical History:  Procedure Laterality Date  . arm surgery Right    age 89   . BUNIONECTOMY Right   . TONSILLECTOMY     age 52   Social History   Social History  Narrative  . Not on file    Objective: Vital Signs: BP 128/76 (BP Location: Right Arm, Patient Position: Sitting, Cuff Size: Normal)   Pulse 60   Resp 12   Ht 5\' 5"  (1.651 m)   Wt 120 lb (54.4 kg)   LMP 01/27/2012   BMI 19.97 kg/m    Physical Exam  Constitutional: She is oriented to person, place, and time. She appears well-developed and well-nourished.  HENT:  Head: Normocephalic and atraumatic.  Eyes: Conjunctivae and EOM are normal.  Neck: Normal range of motion.  Cardiovascular:  Normal rate, regular rhythm, normal heart sounds and intact distal pulses.  Pulmonary/Chest: Effort normal and breath sounds normal.  Abdominal: Soft. Bowel sounds are normal.  Lymphadenopathy:    She has no cervical adenopathy.  Neurological: She is alert and oriented to person, place, and time.  Skin: Skin is warm and dry. Capillary refill takes less than 2 seconds.  Psychiatric: She has a normal mood and affect. Her behavior is normal.  Nursing note and vitals reviewed.    Musculoskeletal Exam: C-spine thoracic lumbar spine good range of motion.  Shoulder joints elbow joints wrist joint MCPs PIPs DIPs were in good range of motion with no synovitis.  Hip joints knee joints ankles MTPs PIPs DIPs were in good range of motion with no synovitis.  Her hands and feet were cold to touch.   CDAI Exam: CDAI Score: Not documented Patient Global Assessment: Not documented; Provider Global Assessment: Not documented Swollen: Not documented; Tender: Not documented Joint Exam   Not documented   There is currently no information documented on the homunculus. Go to the Rheumatology activity and complete the homunculus joint exam.  Investigation: Findings:  04/02/18: TSH 0.245, T4 1.71 04/11/18: Ro <0.2, La <0.2, ANA negative, Sed rate 2, Hep C Ab <0.1, RF <10  Component     Latest Ref Rng & Units 04/02/2018 04/11/2018  TSH     0.450 - 4.500 uIU/mL 0.245 (L)   T4,Free(Direct)     0.82 - 1.77 ng/dL 1.61   ENA SSA (RO) Ab     0.0 - 0.9 AI  <0.2  ENA SSB (LA) Ab     0.0 - 0.9 AI  <0.2  Triiodothyronine,Free,Serum     2.0 - 4.4 pg/mL 3.3   Anti Nuclear Antibody(ANA)     Negative  Negative   Im Component     Latest Ref Rng & Units 04/11/2018  Sed Rate     0 - 40 mm/hr 2  Hep C Virus Ab     0.0 - 0.9 s/co ratio <0.1  RA Latex Turbid.     0.0 - 13.9 IU/mL <10.0  Imaging: No results found.  Recent Labs: Lab Results  Component Value Date   WBC 10.1 04/11/2018   HGB 14.0 04/11/2018    PLT 286 04/11/2018   NA 137 03/07/2018   K 4.4 03/07/2018   CL 96 03/07/2018   CO2 26 03/07/2018   GLUCOSE 78 03/07/2018   BUN 12 03/07/2018   CREATININE 0.89 03/07/2018   BILITOT 0.3 03/07/2018   ALKPHOS 72 03/07/2018   AST 19 03/07/2018   ALT 8 03/07/2018   PROT 6.9 03/07/2018   ALBUMIN 4.5 03/07/2018   CALCIUM 9.6 03/07/2018   GFRAA 86 03/07/2018    Speciality Comments: No specialty comments available.  Procedures:  No procedures performed Allergies: Sulfa antibiotics   Assessment / Plan:     Visit Diagnoses: Sicca syndrome, unspecified (HCC) -she gives  history of dry mouth, dry eyes, vaginal dryness and dry skin.  All autoimmune work-up so far has been negative.  I will obtain some additional labs today.  04/02/18: TSH 0.245, T4 1.717/24/19: Ro <0.2, La <0.2, ANA negative, Sed rate 2, Hep C Ab <0.1, RF <10 - Plan: Cryoglobulin, Angiotensin converting enzyme, Anti-scleroderma antibody, RNP Antibody, Anti-Smith antibody, Anti-DNA antibody, double-stranded, Cardiolipin antibodies, IgG, IgM, IgA, Beta-2 glycoprotein antibodies, Lupus Anticoagulant Eval w/Reflex, C3 and C4, Hepatitis B core antibody, IgM, Hepatitis B surface antigen, Serum protein electrophoresis with reflex, IgG, IgA, IgM, Glucose 6 phosphate dehydrogenase.  I discussed the option of adding fish oil.  We also discussed use of pilocarpine.  Indications side effects contraindications were discussed.  I will start her on pilocarpine 5 mg p.o. 3 times daily which can be gradually increased.  Over-the-counter supplements were discussed.  Raynauds phenomenon-planes of Raynauds without gangrene.  Warm clothing was discussed.  History of hypothyroidism  History of osteopenia-she is on calcium and vitamin D and has been doing exercises.  Former smoker - quit 15 years ago  Other fatigue - Plan: Urinalysis, Routine w reflex microscopic, CK, Cyclic citrul peptide antibody, IgG   Orders: Orders Placed This Encounter    Procedures  . Urinalysis, Routine w reflex microscopic  . CK  . Cyclic citrul peptide antibody, IgG  . Cryoglobulin  . Angiotensin converting enzyme  . Anti-scleroderma antibody  . RNP Antibody  . Anti-Smith antibody  . Anti-DNA antibody, double-stranded  . Cardiolipin antibodies, IgG, IgM, IgA  . Beta-2 glycoprotein antibodies  . Lupus Anticoagulant Eval w/Reflex  . C3 and C4  . Hepatitis B core antibody, IgM  . Hepatitis B surface antigen  . Serum protein electrophoresis with reflex  . IgG, IgA, IgM  . Glucose 6 phosphate dehydrogenase   Meds ordered this encounter  Medications  . pilocarpine (SALAGEN) 5 MG tablet    Sig: Take 1 tablet (5 mg total) by mouth 3 (three) times daily.    Dispense:  90 tablet    Refill:  0    Face-to-face time spent with patient was 50 minutes. Greater than 50% of time was spent in counseling and coordination of care.  Follow-Up Instructions: Return for Sicca syndrome.   Pollyann Savoy, MD  Note - This record has been created using Animal nutritionist.  Chart creation errors have been sought, but may not always  have been located. Such creation errors do not reflect on  the standard of medical care.

## 2018-06-12 ENCOUNTER — Other Ambulatory Visit: Payer: BC Managed Care – PPO

## 2018-06-12 DIAGNOSIS — E039 Hypothyroidism, unspecified: Secondary | ICD-10-CM

## 2018-06-13 LAB — TSH: TSH: 0.333 u[IU]/mL — ABNORMAL LOW (ref 0.450–4.500)

## 2018-06-14 ENCOUNTER — Encounter: Payer: Self-pay | Admitting: Pediatrics

## 2018-06-19 ENCOUNTER — Ambulatory Visit: Payer: BC Managed Care – PPO | Admitting: Rheumatology

## 2018-06-19 ENCOUNTER — Encounter: Payer: Self-pay | Admitting: Rheumatology

## 2018-06-19 VITALS — BP 128/76 | HR 60 | Resp 12 | Ht 65.0 in | Wt 120.0 lb

## 2018-06-19 DIAGNOSIS — Z8639 Personal history of other endocrine, nutritional and metabolic disease: Secondary | ICD-10-CM

## 2018-06-19 DIAGNOSIS — Z8739 Personal history of other diseases of the musculoskeletal system and connective tissue: Secondary | ICD-10-CM | POA: Diagnosis not present

## 2018-06-19 DIAGNOSIS — M3500 Sicca syndrome, unspecified: Secondary | ICD-10-CM | POA: Diagnosis not present

## 2018-06-19 DIAGNOSIS — Z87891 Personal history of nicotine dependence: Secondary | ICD-10-CM | POA: Diagnosis not present

## 2018-06-19 DIAGNOSIS — R5383 Other fatigue: Secondary | ICD-10-CM

## 2018-06-19 DIAGNOSIS — I7300 Raynaud's syndrome without gangrene: Secondary | ICD-10-CM

## 2018-06-19 MED ORDER — PILOCARPINE HCL 5 MG PO TABS: 5.0000 mg | ORAL_TABLET | Freq: Three times a day (TID) | ORAL | 0 refills | Status: DC

## 2018-06-19 NOTE — Patient Instructions (Signed)
Supplements for Osteoarthritis Natural anti-inflammatories can help reduce inflammation and joint stiffness without some of the harmful side effects non-steroidal anti-inflammatories (Advil, Motrin, Aleve, etc.)  Tumeric . Recommended dose 400 mg to 600 mg once daily (can cause stomach upset, may increase to three times daily as tolerated) . Do not take if you are on a blood thinner and stop prior to surgery .  Ginger (root or capsules) . Recommended dose 2 grams in three divided doses or 4 cups of tea daily . Do not take if you are on a blood thinner and stop prior to surgery .  Fish oil or Omega 3 . Two 3 ounce servings of fish a week or 2-3 grams twice daily . Make sure it contains at least 30% of EPA/DHA . Flax seed, Chia seeds, Walnuts, almonds .  Tart Cherry (dried or extract or tablets) . Recommended dose 500 mg twice daily You may be able to find some of these products at your local pharmacy but may also purchase at [employer], [employer], other specialty stores or online.  *Although these are natural products they can still interact with medications.  Always consult with your doctor or pharmacist when starting new supplements and medications*  Patient should be under the care of a physician while taking these supplements. This may not be reproduced without the permission of Dr. Pollyann Savoy.

## 2018-06-25 LAB — URINALYSIS, ROUTINE W REFLEX MICROSCOPIC
Bilirubin Urine: NEGATIVE
GLUCOSE, UA: NEGATIVE
Hgb urine dipstick: NEGATIVE
Ketones, ur: NEGATIVE
Leukocytes, UA: NEGATIVE
Nitrite: NEGATIVE
PH: 8 (ref 5.0–8.0)
PROTEIN: NEGATIVE
Specific Gravity, Urine: 1.009 (ref 1.001–1.03)

## 2018-06-25 LAB — CARDIOLIPIN ANTIBODIES, IGG, IGM, IGA
Anticardiolipin IgA: <11 [APL'U]
Anticardiolipin IgG: <14 [GPL'U]

## 2018-06-25 LAB — ANTI-SMITH ANTIBODY: ENA SM Ab Ser-aCnc: <1 AI

## 2018-06-25 LAB — C3 AND C4
C3 COMPLEMENT: 117 mg/dL (ref 83–193)
C4 COMPLEMENT: 36 mg/dL (ref 15–57)

## 2018-06-25 LAB — HEPATITIS B CORE ANTIBODY, IGM: Hep B C IgM: NONREACTIVE

## 2018-06-25 LAB — IGG, IGA, IGM
IGM, SERUM: 80 mg/dL (ref 50–300)
IgG (Immunoglobin G), Serum: 1118 mg/dL (ref 600–1640)
Immunoglobulin A: 316 mg/dL — ABNORMAL HIGH (ref 47–310)

## 2018-06-25 LAB — LUPUS ANTICOAGULANT EVAL W/ REFLEX
PTT-LA Screen: 34 s (ref <=40)
dRVVT: 36 s (ref <=45)

## 2018-06-25 LAB — PROTEIN ELECTROPHORESIS, SERUM, WITH REFLEX
ALBUMIN ELP: 4.6 g/dL (ref 3.8–4.8)
ALPHA 1: 0.3 g/dL (ref 0.2–0.3)
ALPHA 2: 0.6 g/dL (ref 0.5–0.9)
Beta 2: 0.4 g/dL (ref 0.2–0.5)
Beta Globulin: 0.5 g/dL (ref 0.4–0.6)
GAMMA GLOBULIN: 1 g/dL (ref 0.8–1.7)
Total Protein: 7.4 g/dL (ref 6.1–8.1)

## 2018-06-25 LAB — ANGIOTENSIN CONVERTING ENZYME: Angiotensin-Converting Enzyme: 64 U/L (ref 9–67)

## 2018-06-25 LAB — BETA-2 GLYCOPROTEIN ANTIBODIES
Beta-2 Glyco 1 IgA: <9 SAU (ref <=20)
Beta-2 Glyco 1 IgM: <9 SMU (ref <=20)

## 2018-06-25 LAB — RNP ANTIBODY: Ribonucleic Protein(ENA) Antibody, IgG: <1 AI

## 2018-06-25 LAB — ANTI-DNA ANTIBODY, DOUBLE-STRANDED: ds DNA Ab: 1 IU/mL

## 2018-06-25 LAB — ANTI-SCLERODERMA ANTIBODY: Scleroderma (Scl-70) (ENA) Antibody, IgG: <1 AI

## 2018-06-25 LAB — GLUCOSE 6 PHOSPHATE DEHYDROGENASE: G-6PDH: 17.5 U/g Hgb (ref 7.0–20.5)

## 2018-06-25 LAB — CYCLIC CITRUL PEPTIDE ANTIBODY, IGG: Cyclic Citrullin Peptide Ab: <16 UNITS

## 2018-06-25 LAB — CK: Total CK: 73 U/L (ref 29–143)

## 2018-06-25 LAB — CRYOGLOBULIN: CRYOGLOBULIN, QUALITATIVE ANALYSIS: NOT DETECTED

## 2018-06-25 LAB — HEPATITIS B SURFACE ANTIGEN: HEP B S AG: NONREACTIVE

## 2018-06-25 NOTE — Progress Notes (Signed)
WNL

## 2018-07-04 ENCOUNTER — Encounter: Payer: BC Managed Care – PPO | Admitting: Family Medicine

## 2018-07-17 ENCOUNTER — Other Ambulatory Visit: Payer: BC Managed Care – PPO

## 2018-07-17 DIAGNOSIS — E039 Hypothyroidism, unspecified: Secondary | ICD-10-CM

## 2018-07-18 ENCOUNTER — Encounter: Payer: Self-pay | Admitting: Pediatrics

## 2018-07-18 LAB — T3, FREE: T3 FREE: 2.4 pg/mL (ref 2.0–4.4)

## 2018-07-18 LAB — TSH+FREE T4
FREE T4: 1.1 ng/dL (ref 0.82–1.77)
TSH: 4.01 u[IU]/mL (ref 0.450–4.500)

## 2018-07-18 NOTE — Progress Notes (Signed)
Hello Shamera,  Your lab result is normal.Some minor variations that are not significant are commonly marked abnormal, but do not represent any medical problem for you.  Best regards, Mechele Claude, M.D.

## 2018-07-19 NOTE — Progress Notes (Deleted)
Office Visit Note  Patient: April Herrera             Date of Birth: June 06, 1966           MRN: [id number]             PCP: Johna Sheriff, MD Referring: Johna Sheriff, MD Visit Date: 07/30/2018 Occupation: @GUAROCC @  Subjective:  No chief complaint on file.   History of Present Illness: April Herrera is a 52 y.o. female ***   Activities of Daily Living:  Patient reports morning stiffness for *** {minute/hour:19697}.   Patient {ACTIONS;DENIES/REPORTS:21021675::"Denies"} nocturnal pain.  Difficulty dressing/grooming: {ACTIONS;DENIES/REPORTS:21021675::"Denies"} Difficulty climbing stairs: {ACTIONS;DENIES/REPORTS:21021675::"Denies"} Difficulty getting out of chair: {ACTIONS;DENIES/REPORTS:21021675::"Denies"} Difficulty using hands for taps, buttons, cutlery, and/or writing: {ACTIONS;DENIES/REPORTS:21021675::"Denies"}  No Rheumatology ROS completed.   PMFS History:  Patient Active Problem List   Diagnosis Date Noted  . Hypothyroidism 04/13/2016  . Osteopenia 08/13/2014    Past Medical History:  Diagnosis Date  . Hypotension   . Osteopenia   . Thyroid disease    hypothyroidism    Family History  Problem Relation Age of Onset  . Cancer Mother        breast  . Cancer Father        colon cancer  . Hypothyroidism Sister   . Hashimoto's thyroiditis Son    Past Surgical History:  Procedure Laterality Date  . arm surgery Right    age 39   . BUNIONECTOMY Right   . TONSILLECTOMY     age 52   Social History   Social History Narrative  . Not on file    Objective: Vital Signs: LMP 01/27/2012    Physical Exam   Musculoskeletal Exam: ***  CDAI Exam: CDAI Score: Not documented Patient Global Assessment: Not documented; Provider Global Assessment: Not documented Swollen: Not documented; Tender: Not documented Joint Exam   Not documented   There is currently no information documented on the homunculus. Go to the Rheumatology activity and complete  the homunculus joint exam.  Investigation: No additional findings.  Imaging: No results found.  Recent Labs: Lab Results  Component Value Date   WBC 10.1 04/11/2018   HGB 14.0 04/11/2018   PLT 286 04/11/2018   NA 137 03/07/2018   K 4.4 03/07/2018   CL 96 03/07/2018   CO2 26 03/07/2018   GLUCOSE 78 03/07/2018   BUN 12 03/07/2018   CREATININE 0.89 03/07/2018   BILITOT 0.3 03/07/2018   ALKPHOS 72 03/07/2018   AST 19 03/07/2018   ALT 8 03/07/2018   PROT 7.4 06/19/2018   ALBUMIN 4.5 03/07/2018   CALCIUM 9.6 03/07/2018   GFRAA 86 03/07/2018  UA negative, CK normal, anti-CCP negative, ACE normal, anti-DNA negative, anti-Smith negative, RNP negative, SCL 70-, C3-C4 normal, lupus anticoagulant normal, beta-2 GP 1-, anticardiolipin negative, hepatitis B negative, G6PD normal, SPEP normal, immunoglobulins unremarkable.  Speciality Comments: No specialty comments available.  Procedures:  No procedures performed Allergies: Sulfa antibiotics   Assessment / Plan:     Visit Diagnoses: No diagnosis found.   Orders: No orders of the defined types were placed in this encounter.  No orders of the defined types were placed in this encounter.   Face-to-face time spent with patient was *** minutes. Greater than 50% of time was spent in counseling and coordination of care.  Follow-Up Instructions: No follow-ups on file.   Pollyann Savoy, MD  Note - This record has been created using Animal nutritionist.  Chart creation errors have  been sought, but may not always  have been located. Such creation errors do not reflect on  the standard of medical care.

## 2018-07-30 ENCOUNTER — Ambulatory Visit: Payer: BC Managed Care – PPO | Admitting: Rheumatology

## 2018-08-20 ENCOUNTER — Ambulatory Visit: Payer: BC Managed Care – PPO | Admitting: Family Medicine

## 2018-09-13 ENCOUNTER — Encounter: Payer: Self-pay | Admitting: Family

## 2018-09-13 ENCOUNTER — Telehealth: Payer: Worker's Compensation | Admitting: Family

## 2018-09-13 DIAGNOSIS — B9689 Other specified bacterial agents as the cause of diseases classified elsewhere: Secondary | ICD-10-CM

## 2018-09-13 DIAGNOSIS — J329 Chronic sinusitis, unspecified: Secondary | ICD-10-CM

## 2018-09-13 MED ORDER — AMOXICILLIN-POT CLAVULANATE 875-125 MG PO TABS: 1.0000 | ORAL_TABLET | Freq: Two times a day (BID) | ORAL | 0 refills | Status: AC

## 2018-09-13 NOTE — Progress Notes (Signed)
Thank you for the details you included in the comment boxes. Those details are very helpful in determining the best course of treatment for you and help us to provide the best care. As far as "stronger than" Augmentin, this is actually one of the most effective treatments for your condition and has a special piece added to the antibiotic to prevent the bacteria from working against it. That being said, sometimes we have viral infections that look like bacteria, and the antibiotics work terribly (because they aren't working at all) while our body kills the virus. That said, see plan below, using the antibiotic most likely to help you. I hope you feel better soon.   We are sorry that you are not feeling well.  Here is how we plan to help!  Based on what you have shared with me it looks like you have sinusitis.  Sinusitis is inflammation and infection in the sinus cavities of the head.  Based on your presentation I believe you most likely have Acute Bacterial Sinusitis.  This is an infection caused by bacteria and is treated with antibiotics. I have prescribed Augmentin 875mg /125mg  one tablet twice daily with food, for 7 days. You may use an oral decongestant such as Mucinex D or if you have glaucoma or high blood pressure use plain Mucinex. Saline nasal spray help and can safely be used as often as needed for congestion.  If you develop worsening sinus pain, fever or notice severe headache and vision changes, or if symptoms are not better after completion of antibiotic, please schedule an appointment with a health care provider.    Sinus infections are not as easily transmitted as other respiratory infection, however we still recommend that you avoid close contact with loved ones, especially the very young and elderly.  Remember to wash your hands thoroughly throughout the day as this is the number one way to prevent the spread of infection!  Home Care:  Only take medications as instructed by your medical  team.  Complete the entire course of an antibiotic.  Do not take these medications with alcohol.  A steam or ultrasonic humidifier can help congestion.  You can place a towel over your head and breathe in the steam from hot water coming from a faucet.  Avoid close contacts especially the very young and the elderly.  Cover your mouth when you cough or sneeze.  Always remember to wash your hands.  Get Help Right Away If:  You develop worsening fever or sinus pain.  You develop a severe head ache or visual changes.  Your symptoms persist after you have completed your treatment plan.  Make sure you  Understand these instructions.  Will watch your condition.  Will get help right away if you are not doing well or get worse.  Your e-visit answers were reviewed by a board certified advanced clinical practitioner to complete your personal care plan.  Depending on the condition, your plan could have included both over the counter or prescription medications.  If there is a problem please reply  once you have received a response from your provider.  Your safety is important to us.  If you have drug allergies check your prescription carefully.    You can use MyChart to ask questions about today's visit, request a non-urgent call back, or ask for a work or school excuse for 24 hours related to this e-Visit. If it has been greater than 24 hours you will need to follow up with your  provider, or enter a new e-Visit to address those concerns.  You will get an e-mail in the next two days asking about your experience.  I hope that your e-visit has been valuable and will speed your recovery. Thank you for using e-visits.

## 2018-09-14 ENCOUNTER — Ambulatory Visit: Payer: BC Managed Care – PPO | Admitting: Pediatrics

## 2018-10-29 ENCOUNTER — Encounter: Payer: Self-pay | Admitting: Pediatrics

## 2018-11-02 ENCOUNTER — Ambulatory Visit: Payer: BC Managed Care – PPO | Admitting: Family Medicine

## 2018-11-02 ENCOUNTER — Encounter: Payer: Self-pay | Admitting: Family Medicine

## 2018-11-02 VITALS — BP 115/76 | HR 70 | Temp 97.8°F | Ht 65.0 in | Wt 122.4 lb

## 2018-11-02 DIAGNOSIS — E039 Hypothyroidism, unspecified: Secondary | ICD-10-CM

## 2018-11-02 DIAGNOSIS — B9689 Other specified bacterial agents as the cause of diseases classified elsewhere: Secondary | ICD-10-CM

## 2018-11-02 DIAGNOSIS — J302 Other seasonal allergic rhinitis: Secondary | ICD-10-CM | POA: Diagnosis not present

## 2018-11-02 DIAGNOSIS — J019 Acute sinusitis, unspecified: Secondary | ICD-10-CM | POA: Diagnosis not present

## 2018-11-02 MED ORDER — MONTELUKAST SODIUM 10 MG PO TABS: 10.0000 mg | ORAL_TABLET | Freq: Every day | ORAL | 3 refills | Status: DC

## 2018-11-02 MED ORDER — AMOXICILLIN-POT CLAVULANATE 875-125 MG PO TABS: 1.0000 | ORAL_TABLET | Freq: Two times a day (BID) | ORAL | 0 refills | Status: AC

## 2018-11-02 NOTE — Patient Instructions (Signed)

## 2018-11-02 NOTE — Progress Notes (Signed)
Subjective:  Patient ID: April Herrera, female    DOB: [birth year], 53 y.o.   MRN: [id number]  Chief Complaint:  Medical Management of Chronic Issues; Facial Pain; Low grade fever; and Nasal Congestion   HPI: April Herrera is a 53 y.o. female presenting on 11/02/2018 for Medical Management of Chronic Issues; Facial Pain; Low grade fever; and Nasal Congestion   1. Hypothyroidism, unspecified type  Doing well on current dose of Synthroid. Denies excessive fatigue, mood disturbances, constipation, dry skin, hair loss, or nail changes. Is compliant with medications with no adverse side effects.    2. Acute bacterial rhinosinusitis  Symptoms started 2-3 weeks ago. Pt complains of frontal and maxillary sinus pressure. She has been running a low grade fever. She has rhinorrhea, postnasal drainage, and sore throat. Has been using [employer], Flonase, Airborne, and Echinacea without relief of symptoms.   3. Seasonal allergic rhinitis, unspecified trigger  Pt reports her rhinorrhea and dry cough have increased over the last several months. States she is out of her Singulair. States this controlled her symptoms well and would like this refilled.       Relevant past medical, surgical, family, and social history reviewed and updated as indicated.  Allergies and medications reviewed and updated.   Past Medical History:  Diagnosis Date  . Hypotension   . Osteopenia   . Thyroid disease    hypothyroidism    Past Surgical History:  Procedure Laterality Date  . arm surgery Right    age 25   . BUNIONECTOMY Right   . TONSILLECTOMY     age 41    Social History   Socioeconomic History  . Marital status: Married    Spouse name: Not on file  . Number of children: Not on file  . Years of education: Not on file  . Highest education level: Not on file  Occupational History  . Not on file  Social Needs  . Financial resource strain: Not on file  . Food insecurity:    Worry: Not  on file    Inability: Not on file  . Transportation needs:    Medical: Not on file    Non-medical: Not on file  Tobacco Use  . Smoking status: Former Smoker    Types: Cigarettes  . Smokeless tobacco: Never Used  Substance and Sexual Activity  . Alcohol use: Yes    Alcohol/week: 3.0 standard drinks    Types: 3 Cans of beer per week  . Drug use: No  . Sexual activity: Not on file  Lifestyle  . Physical activity:    Days per week: Not on file    Minutes per session: Not on file  . Stress: Not on file  Relationships  . Social connections:    Talks on phone: Not on file    Gets together: Not on file    Attends religious service: Not on file    Active member of club or organization: Not on file    Attends meetings of clubs or organizations: Not on file    Relationship status: Not on file  . Intimate partner violence:    Fear of current or ex partner: Not on file    Emotionally abused: Not on file    Physically abused: Not on file    Forced sexual activity: Not on file  Other Topics Concern  . Not on file  Social History Narrative  . Not on file    Outpatient Encounter Medications  as of 11/02/2018  Medication Sig  . Biotin w/ Vitamins C & E (HAIR/SKIN/NAILS PO) Take by mouth daily.  . Calcium Citrate-Vitamin D (CALCIUM + D PO) Take by mouth.  . Fish Oil-Cholecalciferol (FISH OIL + D3 PO) Take by mouth.  . fluticasone (FLONASE) 50 MCG/ACT nasal spray Use 2 sprays in each nostril once daily (Patient taking differently: as needed. )  . levothyroxine (SYNTHROID, LEVOTHROID) 88 MCG tablet Take 1 tablet (88 mcg total) by mouth daily.  Marland Kitchen LYSINE PO Take by mouth daily.  . Multiple Vitamins-Minerals (MULTI COMPLETE PO) Take by mouth.  . RESTASIS MULTIDOSE 0.05 % ophthalmic emulsion Instill 1 drop in each eye 2 times a day  . XIIDRA 5 % SOLN place 1 drop into each eye twice daily  . [DISCONTINUED] montelukast (SINGULAIR) 10 MG tablet Take 1 tablet (10 mg total) by mouth at bedtime.    . [DISCONTINUED] pilocarpine (SALAGEN) 5 MG tablet Take 1 tablet (5 mg total) by mouth 3 (three) times daily.  Marland Kitchen amoxicillin-clavulanate (AUGMENTIN) 875-125 MG tablet Take 1 tablet by mouth 2 (two) times daily for 7 days.  . montelukast (SINGULAIR) 10 MG tablet Take 1 tablet (10 mg total) by mouth at bedtime.  . [DISCONTINUED] predniSONE (DELTASONE) 20 MG tablet 2 po at sametime daily for 5 days (Patient not taking: Reported on 11/02/2018)   No facility-administered encounter medications on file as of 11/02/2018.     Allergies  Allergen Reactions  . Sulfa Antibiotics     Review of Systems  Constitutional: Positive for fatigue and fever. Negative for activity change, appetite change, chills and unexpected weight change.  HENT: Positive for congestion, ear pain, postnasal drip, rhinorrhea, sinus pressure, sinus pain and sore throat.   Respiratory: Positive for cough. Negative for shortness of breath and wheezing.   Cardiovascular: Negative for chest pain, palpitations and leg swelling.  Gastrointestinal: Negative for abdominal pain, constipation, diarrhea, nausea and vomiting.  Endocrine: Negative.   Genitourinary: Negative.   Musculoskeletal: Negative for arthralgias and myalgias.  Neurological: Positive for headaches. Negative for dizziness, tremors, seizures, syncope, facial asymmetry, speech difficulty, weakness, light-headedness and numbness.  Hematological: Negative.   Psychiatric/Behavioral: Negative for confusion, decreased concentration, dysphoric mood and sleep disturbance.  All other systems reviewed and are negative.       Objective:  BP 115/76   Pulse 70   Temp 97.8 F (36.6 C) (Oral)   Ht 5\' 5"  (1.651 m)   Wt 122 lb 6.4 oz (55.5 kg)   LMP 01/27/2012   BMI 20.37 kg/m    Wt Readings from Last 3 Encounters:  11/02/18 122 lb 6.4 oz (55.5 kg)  06/19/18 120 lb (54.4 kg)  04/11/18 120 lb 9.6 oz (54.7 kg)    Physical Exam Vitals signs and nursing note reviewed.   Constitutional:      General: She is not in acute distress.    Appearance: Normal appearance. She is well-developed, well-groomed and normal weight. She is not ill-appearing or toxic-appearing.  HENT:     Head: Normocephalic and atraumatic.     Right Ear: Hearing, ear canal and external ear normal. A middle ear effusion is present. Tympanic membrane is not perforated or erythematous.     Left Ear: Hearing, ear canal and external ear normal. A middle ear effusion is present. Tympanic membrane is not perforated or erythematous.     Nose: Congestion and rhinorrhea present. Rhinorrhea is purulent.     Right Turbinates: Swollen.     Left Turbinates: Swollen.  Right Sinus: Maxillary sinus tenderness and frontal sinus tenderness present.     Left Sinus: Maxillary sinus tenderness and frontal sinus tenderness present.     Mouth/Throat:     Lips: Pink.     Mouth: Mucous membranes are moist.     Pharynx: Posterior oropharyngeal erythema present. No pharyngeal swelling, oropharyngeal exudate or uvula swelling.     Tonsils: No tonsillar exudate or tonsillar abscesses.  Eyes:     General: Lids are normal.     Extraocular Movements: Extraocular movements intact.     Conjunctiva/sclera: Conjunctivae normal.     Pupils: Pupils are equal, round, and reactive to light.  Neck:     Musculoskeletal: Full passive range of motion without pain and neck supple.     Thyroid: No thyroid mass, thyromegaly or thyroid tenderness.     Trachea: Trachea and phonation normal.  Cardiovascular:     Rate and Rhythm: Normal rate and regular rhythm.     Heart sounds: Normal heart sounds. No murmur. No friction rub. No gallop.   Pulmonary:     Effort: Pulmonary effort is normal. No respiratory distress.     Breath sounds: Normal breath sounds.  Skin:    General: Skin is warm and dry.     Capillary Refill: Capillary refill takes less than 2 seconds.  Neurological:     General: No focal deficit present.     Mental  Status: She is alert and oriented to person, place, and time.  Psychiatric:        Mood and Affect: Mood normal.        Behavior: Behavior normal. Behavior is cooperative.        Thought Content: Thought content normal.        Judgment: Judgment normal.     Results for orders placed or performed in visit on 07/17/18  T3, free  Result Value Ref Range   T3, Free 2.4 2.0 - 4.4 pg/mL  TSH + free T4  Result Value Ref Range   TSH 4.010 0.450 - 4.500 uIU/mL   Free T4 1.10 0.82 - 1.77 ng/dL       Pertinent labs & imaging results that were available during my care of the patient were reviewed by me and considered in my medical decision making.  Assessment & Plan:  [name] was seen today for medical management of chronic issues, facial pain, low grade fever and nasal congestion.  Diagnoses and all orders for this visit:  Hypothyroidism, unspecified type Labs pending. Will change therapy if warranted.  -     TSH  Acute bacterial rhinosinusitis Symptomatic care discussed. Continue Flonase. Report any new or worsening symptoms. Medications as prescribed.  -     amoxicillin-clavulanate (AUGMENTIN) 875-125 MG tablet; Take 1 tablet by mouth 2 (two) times daily for 7 days.  Seasonal allergic rhinitis, unspecified trigger Continue Flonase and restart Singulair.  -     montelukast (SINGULAIR) 10 MG tablet; Take 1 tablet (10 mg total) by mouth at bedtime.     Continue all other maintenance medications.  Follow up plan: Return in about 6 months (around 05/03/2019), or if symptoms worsen or fail to improve.  Educational handout given for sinusitis   The above assessment and management plan was discussed with the patient. The patient verbalized understanding of and has agreed to the management plan. Patient is aware to call the clinic if symptoms persist or worsen. Patient is aware when to return to the clinic for a follow-up visit. Patient  educated on when it is appropriate to go to the  emergency department.   Kari Baars, FNP-C [facility] [phone]

## 2018-11-03 LAB — TSH: TSH: 6.49 u[IU]/mL — AB (ref 0.450–4.500)

## 2018-11-05 MED ORDER — LEVOTHYROXINE SODIUM 100 MCG PO TABS: 100.0000 ug | ORAL_TABLET | Freq: Every day | ORAL | 3 refills | Status: DC

## 2018-11-05 NOTE — Addendum Note (Signed)
Addended by: Sonny Masters on: 11/05/2018 08:06 AM   Modules accepted: Orders

## 2019-01-23 ENCOUNTER — Other Ambulatory Visit: Payer: Self-pay

## 2019-01-23 ENCOUNTER — Ambulatory Visit (INDEPENDENT_AMBULATORY_CARE_PROVIDER_SITE_OTHER): Payer: BC Managed Care – PPO | Admitting: Family Medicine

## 2019-01-23 DIAGNOSIS — E038 Other specified hypothyroidism: Secondary | ICD-10-CM | POA: Diagnosis not present

## 2019-01-23 DIAGNOSIS — E063 Autoimmune thyroiditis: Secondary | ICD-10-CM | POA: Diagnosis not present

## 2019-01-23 NOTE — Progress Notes (Signed)
Telephone visit  Subjective: CC: Follow-up hypothyroidism PCP: Sonny Masters, FNP [id number] April Herrera is a 53 y.o. female calls for telephone consult today. Patient provides verbal consent for consult held via phone.  Location of patient: Home Location of provider: Working remotely from home Others present for call: None   1.  Hashimoto's thyroiditis  No history of radiation to the neck or neck surgeries.  Patient's history is significant for Hashimoto's thyroiditis that onset at age 79.  At that time she was hyperthyroid.  No treatments for hyperthyroid state.  She subsequently became hypothyroid in her 46s and was started on levothyroxine.  She has had quite a bit of variability in her thyroid level over the last several months for an unknown reason.  She no longer takes biotin containing products.  She saw a naturopathic specialist who recommended that she avoid gluten, dairy, soy in efforts to improve thyroid function.  She does take in salt with iodine.  She has maintained the above diet for over 2 years now.  Weight has remained stable and she was 117# today.  She does feel tired but continues to be active at baseline.  She has quite a bit of stress during the COVID-19 outbreak.  She has been quite constipated as well despite attempts of improving bowel movements with juice.  She has intermittent heart palpitations.     ROS: Per HPI  Allergies  Allergen Reactions  . Sulfa Antibiotics    Past Medical History:  Diagnosis Date  . Hypotension   . Osteopenia   . Thyroid disease    hypothyroidism    Current Outpatient Medications:  .  Calcium Citrate-Vitamin D (CALCIUM + D PO), Take by mouth., Disp: , Rfl:  .  Fish Oil-Cholecalciferol (FISH OIL + D3 PO), Take by mouth., Disp: , Rfl:  .  fluticasone (FLONASE) 50 MCG/ACT nasal spray, Use 2 sprays in each nostril once daily (Patient taking differently: as needed. ), Disp: 16 g, Rfl: 4 .  levothyroxine (SYNTHROID, LEVOTHROID) 100  MCG tablet, Take 1 tablet (100 mcg total) by mouth daily., Disp: 90 tablet, Rfl: 3 .  LYSINE PO, Take by mouth daily., Disp: , Rfl:  .  montelukast (SINGULAIR) 10 MG tablet, Take 1 tablet (10 mg total) by mouth at bedtime., Disp: 30 tablet, Rfl: 3 .  Multiple Vitamins-Minerals (MULTI COMPLETE PO), Take by mouth., Disp: , Rfl:  .  RESTASIS MULTIDOSE 0.05 % ophthalmic emulsion, Instill 1 drop in each eye 2 times a day, Disp: , Rfl: 3 .  XIIDRA 5 % SOLN, place 1 drop into each eye twice daily, Disp: , Rfl: 3  Assessment/ Plan: 53 y.o. female   1. Hypothyroidism due to Hashimoto's thyroiditis Symptomatic.  Possibly undertreated at this point.  Check thyroid panel.  We will adjust medications pending this result.  Based on her weight, her dose based on a 1 to 2 mcg/kg dose should be between 50 and 100 mcg daily.  Could consider prescribing brand name versus generic capsule form.  I wonder if she would have better absorption.  - Thyroid Panel With TSH   Start time: 10:54am End time: 11:06am  Total time spent on patient care (including telephone call/ virtual visit): 15 minutes  Aren Pryde Hulen Skains, DO [facility] [phone]

## 2019-01-24 LAB — THYROID PANEL WITH TSH
Free Thyroxine Index: 2.4 (ref 1.2–4.9)
T3 Uptake Ratio: 28 % (ref 24–39)
T4, Total: 8.6 ug/dL (ref 4.5–12.0)
TSH: 0.074 u[IU]/mL — ABNORMAL LOW (ref 0.450–4.500)

## 2019-01-28 ENCOUNTER — Other Ambulatory Visit: Payer: Self-pay | Admitting: Family Medicine

## 2019-01-28 ENCOUNTER — Telehealth: Payer: Self-pay | Admitting: Family Medicine

## 2019-01-28 DIAGNOSIS — E039 Hypothyroidism, unspecified: Secondary | ICD-10-CM

## 2019-01-28 MED ORDER — LEVOTHYROXINE SODIUM 88 MCG PO TABS: 88.0000 ug | ORAL_TABLET | Freq: Every day | ORAL | 0 refills | Status: DC

## 2019-01-28 NOTE — Progress Notes (Signed)
Reviewed labs with patient.  Her TSH is suppressed.  I have reduced her dose to 88 mcg daily.  We will plan to recheck in 6 weeks.  Her lab is in place and she will come in at the end of June to have this collected.  Ashly M. Nadine Counts, DO [facility]

## 2019-01-28 NOTE — Telephone Encounter (Signed)
Already aware

## 2019-01-30 ENCOUNTER — Encounter: Payer: Self-pay | Admitting: Family Medicine

## 2019-02-16 ENCOUNTER — Encounter: Payer: Self-pay | Admitting: Family Medicine

## 2019-03-11 ENCOUNTER — Other Ambulatory Visit: Payer: Self-pay

## 2019-03-11 ENCOUNTER — Other Ambulatory Visit: Payer: BC Managed Care – PPO

## 2019-03-11 DIAGNOSIS — E039 Hypothyroidism, unspecified: Secondary | ICD-10-CM

## 2019-03-12 ENCOUNTER — Other Ambulatory Visit: Payer: Self-pay | Admitting: Family Medicine

## 2019-03-12 DIAGNOSIS — E038 Other specified hypothyroidism: Secondary | ICD-10-CM

## 2019-03-12 LAB — THYROID PANEL WITH TSH
Free Thyroxine Index: 2.4 (ref 1.2–4.9)
T3 Uptake Ratio: 29 % (ref 24–39)
T4, Total: 8.4 ug/dL (ref 4.5–12.0)
TSH: 0.168 u[IU]/mL — ABNORMAL LOW (ref 0.450–4.500)

## 2019-03-12 MED ORDER — LEVOTHYROXINE SODIUM 75 MCG PO TABS: 75.0000 ug | ORAL_TABLET | Freq: Every day | ORAL | 0 refills | Status: DC

## 2019-03-18 ENCOUNTER — Encounter: Payer: Self-pay | Admitting: Family Medicine

## 2019-04-25 ENCOUNTER — Other Ambulatory Visit: Payer: BC Managed Care – PPO

## 2019-04-25 ENCOUNTER — Other Ambulatory Visit: Payer: Self-pay

## 2019-04-25 DIAGNOSIS — E039 Hypothyroidism, unspecified: Secondary | ICD-10-CM

## 2019-04-25 DIAGNOSIS — E038 Other specified hypothyroidism: Secondary | ICD-10-CM

## 2019-04-26 ENCOUNTER — Encounter: Payer: Self-pay | Admitting: Family Medicine

## 2019-04-26 ENCOUNTER — Other Ambulatory Visit: Payer: Self-pay | Admitting: Family Medicine

## 2019-04-26 DIAGNOSIS — E038 Other specified hypothyroidism: Secondary | ICD-10-CM

## 2019-04-26 LAB — THYROID PANEL WITH TSH
Free Thyroxine Index: 1.6 (ref 1.2–4.9)
T3 Uptake Ratio: 26 % (ref 24–39)
T4, Total: 6.1 ug/dL (ref 4.5–12.0)
TSH: 3.41 u[IU]/mL (ref 0.450–4.500)

## 2019-04-26 MED ORDER — LEVOTHYROXINE SODIUM 75 MCG PO TABS: 75.0000 ug | ORAL_TABLET | Freq: Every day | ORAL | 1 refills | Status: DC

## 2019-08-04 ENCOUNTER — Telehealth: Payer: Self-pay | Admitting: Physician Assistant

## 2019-08-04 ENCOUNTER — Encounter (INDEPENDENT_AMBULATORY_CARE_PROVIDER_SITE_OTHER): Payer: Self-pay

## 2019-08-04 DIAGNOSIS — Z20822 Contact with and (suspected) exposure to covid-19: Secondary | ICD-10-CM

## 2019-08-04 NOTE — Progress Notes (Signed)
E-Visit for Corona Virus Screening   Your current symptoms could be consistent with the coronavirus.  Many health care providers can now test patients at their office but not all are.  [facility] has multiple testing sites. For information on our COVID testing locations and hours go to [email or web address]  Please quarantine yourself while awaiting your test results.  We are enrolling you in our MyChart Home Montioring for COVID19 . Daily you will receive a questionnaire within the MyChart website. Our COVID 19 response team willl be monitoriing your responses daily.    COVID-19 is a respiratory illness with symptoms that are similar to the flu. Symptoms are typically mild to moderate, but there have been cases of severe illness and death due to the virus. The following symptoms may appear 2-14 days after exposure: . Fever . Cough . Shortness of breath or difficulty breathing . Chills . Repeated shaking with chills . Muscle pain . Headache . Sore throat . New loss of taste or smell . Fatigue . Congestion or runny nose . Nausea or vomiting . Diarrhea  It is vitally important that if you feel that you have an infection such as this virus or any other virus that you stay home and away from places where you may spread it to others.  You should self-quarantine for 14 days if you have symptoms that could potentially be coronavirus or have been in close contact a with a person diagnosed with COVID-19 within the last 2 weeks. You should avoid contact with people age 65 and older.   You should wear a mask or cloth face covering over your nose and mouth if you must be around other people or animals, including pets (even at home). Try to stay at least 6 feet away from other people. This will protect the people around you.  You may also take acetaminophen (Tylenol) as needed for fever.   Reduce your risk of any infection by using the same precautions used for avoiding the  common cold or flu:  . Wash your hands often with soap and warm water for at least 20 seconds.  If soap and water are not readily available, use an alcohol-based hand sanitizer with at least 60% alcohol.  . If coughing or sneezing, cover your mouth and nose by coughing or sneezing into the elbow areas of your shirt or coat, into a tissue or into your sleeve (not your hands). . Avoid shaking hands with others and consider head nods or verbal greetings only. . Avoid touching your eyes, nose, or mouth with unwashed hands.  . Avoid close contact with people who are sick. . Avoid places or events with large numbers of people in one location, like concerts or sporting events. . Carefully consider travel plans you have or are making. . If you are planning any travel outside or inside the [place], visit the CDC's Travelers' Health webpage for the latest health notices. . If you have some symptoms but not all symptoms, continue to monitor at home and seek medical attention if your symptoms worsen. . If you are having a medical emergency, call 911.  HOME CARE . Only take medications as instructed by your medical team. . Drink plenty of fluids and get plenty of rest. . A steam or ultrasonic humidifier can help if you have congestion.   GET HELP RIGHT AWAY IF YOU HAVE EMERGENCY WARNING SIGNS** FOR COVID-19. If you or someone is showing any of these signs seek emergency medical care immediately. Call   911 or proceed to your closest emergency facility if: . You develop worsening high fever. . Trouble breathing . Bluish lips or face . Persistent pain or pressure in the chest . New confusion . Inability to wake or stay awake . You cough up blood. . Your symptoms become more severe  **This list is not all possible symptoms. Contact your medical provider for any symptoms that are sever or concerning to you.   MAKE SURE YOU   Understand these instructions.  Will watch your condition.  Will get help right  away if you are not doing well or get worse.  Your e-visit answers were reviewed by a board certified advanced clinical practitioner to complete your personal care plan.  Depending on the condition, your plan could have included both over the counter or prescription medications.  If there is a problem please reply once you have received a response from your provider.  Your safety is important to us.  If you have drug allergies check your prescription carefully.    You can use MyChart to ask questions about today's visit, request a non-urgent call back, or ask for a work or school excuse for 24 hours related to this e-Visit. If it has been greater than 24 hours you will need to follow up with your provider, or enter a new e-Visit to address those concerns. You will get an e-mail in the next two days asking about your experience.  I hope that your e-visit has been valuable and will speed your recovery. Thank you for using e-visits.    

## 2019-08-04 NOTE — Progress Notes (Signed)
I have spent 5 minutes in review of e-visit questionnaire, review and updating patient chart, medical decision making and response to patient.   Wyvonne Carda Cody Safal Halderman, PA-C    

## 2020-01-22 ENCOUNTER — Other Ambulatory Visit: Payer: Self-pay | Admitting: Family Medicine

## 2020-01-22 DIAGNOSIS — E038 Other specified hypothyroidism: Secondary | ICD-10-CM

## 2020-01-22 DIAGNOSIS — E063 Autoimmune thyroiditis: Secondary | ICD-10-CM

## 2020-01-29 ENCOUNTER — Encounter: Payer: Self-pay | Admitting: Family Medicine

## 2020-01-29 DIAGNOSIS — E038 Other specified hypothyroidism: Secondary | ICD-10-CM

## 2020-01-30 ENCOUNTER — Telehealth: Payer: Self-pay | Admitting: Family Medicine

## 2020-01-30 MED ORDER — LEVOTHYROXINE SODIUM 75 MCG PO TABS: 75.0000 ug | ORAL_TABLET | Freq: Every day | ORAL | 1 refills | Status: DC

## 2020-01-30 NOTE — Telephone Encounter (Signed)
  REFERRAL REQUEST Telephone Note 01/30/2020  What type of referral do you need? endocrinology  Have you been seen at our office for this problem? No but cannot wait to see Nadine Counts 03/09/2020 (Advise that they may need an appointment with their PCP before a referral can be done)  Is there a particular doctor or location that you prefer? In [place].  Patient notified that referrals can take up to a week or longer to process. If they haven't heard anything within a week they should call back and speak with the referral department.

## 2020-01-30 NOTE — Telephone Encounter (Signed)
°  Prescription Request  01/30/2020  What is the name of the medication or equipment? Levothyroxine  Have you contacted your pharmacy to request a refill? (if applicable) Yes  Which pharmacy would you like this sent to? [facility], [place]  Pt called and scheduled an appt to see Dr Nadine Counts on 03/09/20 for check up/med refill. Pt requesting that enough of the Rx be sent to pharmacy to last her until she can come in for appt.   Patient notified that their request is being sent to the clinical staff for review and that they should receive a response within 2 business days.

## 2020-01-31 NOTE — Telephone Encounter (Signed)
Aware, provider will return on Monday.  She had an endocrinology referral in 2019 but did not go?

## 2020-02-04 ENCOUNTER — Other Ambulatory Visit: Payer: Self-pay | Admitting: Family Medicine

## 2020-02-04 DIAGNOSIS — E038 Other specified hypothyroidism: Secondary | ICD-10-CM

## 2020-02-04 NOTE — Telephone Encounter (Signed)
Referral placed.

## 2020-02-05 NOTE — Telephone Encounter (Signed)
Called patient, no answer, left message to return call 

## 2020-02-07 NOTE — Telephone Encounter (Signed)
Voice mail was left on may 18 that referral was approved by our staff.

## 2020-03-09 ENCOUNTER — Ambulatory Visit: Payer: BC Managed Care – PPO | Admitting: Family Medicine

## 2020-03-09 ENCOUNTER — Encounter: Payer: Self-pay | Admitting: Family Medicine

## 2020-03-09 ENCOUNTER — Other Ambulatory Visit: Payer: Self-pay

## 2020-03-09 VITALS — BP 132/83 | HR 67 | Temp 97.7°F | Ht 65.0 in | Wt 120.0 lb

## 2020-03-09 DIAGNOSIS — E038 Other specified hypothyroidism: Secondary | ICD-10-CM

## 2020-03-09 DIAGNOSIS — E063 Autoimmune thyroiditis: Secondary | ICD-10-CM

## 2020-03-09 DIAGNOSIS — Z91018 Allergy to other foods: Secondary | ICD-10-CM | POA: Diagnosis not present

## 2020-03-09 DIAGNOSIS — F439 Reaction to severe stress, unspecified: Secondary | ICD-10-CM | POA: Diagnosis not present

## 2020-03-09 DIAGNOSIS — Z13220 Encounter for screening for lipoid disorders: Secondary | ICD-10-CM

## 2020-03-09 NOTE — Progress Notes (Signed)
Subjective: CC: hypothyroidism PCP: Janora Norlander, DO [id number] L Akerley is a 54 y.o. female presenting to clinic today for:  1. Hypothyroidism Patient reports compliance with her Synthroid 75 mcg daily.  She typically takes this around 4 AM each day.  She is had some constipation but thinks this is likely related to stressors that she has going on.  Her mother was recently enrolled in hospice in [place].  Her sister is moving from [place] to help with her mother.  She feels like she has good emotional support.  She has been taking some OTC minerals to help with her mood and she does feel that this is helping.  No change in weight.  No heart palpitations.  2.  Food allergy Patient would like to be screened for food allergies.  She has a company that she is interested and will have the order sent to me.   ROS: Per HPI  Allergies  Allergen Reactions  . Sulfa Antibiotics    Past Medical History:  Diagnosis Date  . Hypotension   . Osteopenia   . Thyroid disease    hypothyroidism    Current Outpatient Medications:  .  Calcium Citrate-Vitamin D (CALCIUM + D PO), Take by mouth., Disp: , Rfl:  .  Fish Oil-Cholecalciferol (FISH OIL + D3 PO), Take by mouth., Disp: , Rfl:  .  fluticasone (FLONASE) 50 MCG/ACT nasal spray, Use 2 sprays in each nostril once daily (Patient taking differently: as needed. ), Disp: 16 g, Rfl: 4 .  levothyroxine (SYNTHROID) 75 MCG tablet, Take 1 tablet (75 mcg total) by mouth daily., Disp: 90 tablet, Rfl: 1 .  LYSINE PO, Take by mouth daily., Disp: , Rfl:  .  montelukast (SINGULAIR) 10 MG tablet, Take 1 tablet (10 mg total) by mouth at bedtime., Disp: 30 tablet, Rfl: 3 .  Multiple Vitamins-Minerals (MULTI COMPLETE PO), Take by mouth., Disp: , Rfl:  .  RESTASIS MULTIDOSE 0.05 % ophthalmic emulsion, Instill 1 drop in each eye 2 times a day, Disp: , Rfl: 3 .  XIIDRA 5 % SOLN, place 1 drop into each eye twice daily, Disp: , Rfl: 3 Social History    Socioeconomic History  . Marital status: Married    Spouse name: Not on file  . Number of children: Not on file  . Years of education: Not on file  . Highest education level: Not on file  Occupational History  . Not on file  Tobacco Use  . Smoking status: Former Smoker    Types: Cigarettes  . Smokeless tobacco: Never Used  Vaping Use  . Vaping Use: Never used  Substance and Sexual Activity  . Alcohol use: Yes    Alcohol/week: 3.0 standard drinks    Types: 3 Cans of beer per week  . Drug use: No  . Sexual activity: Not on file  Other Topics Concern  . Not on file  Social History Narrative  . Not on file   Social Determinants of Health   Financial Resource Strain:   . Difficulty of Paying Living Expenses:   Food Insecurity:   . Worried About Charity fundraiser in the Last Year:   . Arboriculturist in the Last Year:   Transportation Needs:   . Film/video editor (Medical):   Marland Kitchen Lack of Transportation (Non-Medical):   Physical Activity:   . Days of Exercise per Week:   . Minutes of Exercise per Session:   Stress:   . Feeling  of Stress :   Social Connections:   . Frequency of Communication with Friends and Family:   . Frequency of Social Gatherings with Friends and Family:   . Attends Religious Services:   . Active Member of Clubs or Organizations:   . Attends Archivist Meetings:   Marland Kitchen Marital Status:   Intimate Partner Violence:   . Fear of Current or Ex-Partner:   . Emotionally Abused:   Marland Kitchen Physically Abused:   . Sexually Abused:    Family History  Problem Relation Age of Onset  . Cancer Mother        breast  . Cancer Father        colon cancer  . Hypothyroidism Sister   . Hashimoto's thyroiditis Son     Objective: Office vital signs reviewed. BP 132/83   Pulse 67   Temp 97.7 F (36.5 C) (Temporal)   Ht _0  (1.651 m)   Wt 120 lb (54.4 kg)   LMP 01/27/2012   SpO2 97%   BMI 19.97 kg/m   Physical Examination:  General: Awake,  alert, well nourished, No acute distress HEENT: Normal; no palpable thyroid masses.  No exophthalmos Cardio: regular rate and rhythm, S1S2 heard, no murmurs appreciated Pulm: clear to auscultation bilaterally, no wheezes, rhonchi or rales; normal work of breathing on room air Extremities: warm, well perfused, No edema, cyanosis or clubbing; +2 pulses bilaterally MSK: normal gait and station Skin: dry; intact; no rashes or lesions; normal temperature Neuro: No tremor  Depression screen [facility] 2/9 03/09/2020 11/02/2018 04/11/2018  Decreased Interest 0 0 0  Down, Depressed, Hopeless 0 0 0  PHQ - 2 Score 0 0 0  Altered sleeping 0 - -  Tired, decreased energy 0 - -  Change in appetite 0 - -  Feeling bad or failure about yourself  0 - -  Trouble concentrating 0 - -  Moving slowly or fidgety/restless 0 - -  Suicidal thoughts 0 - -  PHQ-9 Score 0 - -   No flowsheet data found.  Assessment/ Plan: 54 y.o. female   1. Hypothyroidism due to Hashimoto's thyroiditis Asymptomatic.  Check thyroid panel and other labs - Thyroid Panel With TSH - CMP14+EGFR - Lipid Panel  2. Screening, lipid - CMP14+EGFR - Lipid Panel  3. Food allergy She will have these orders sent to me  4. Stress at home At this time doing okay with support and self coping mechanisms.  We discussed that if symptoms were to progress or she becomes concerned she can come back and see me.  We could consider pharmacologic intervention at that point   No orders of the defined types were placed in this encounter.  No orders of the defined types were placed in this encounter.    Janora Norlander, DO [place] [phone]

## 2020-03-10 ENCOUNTER — Encounter: Payer: Self-pay | Admitting: Family Medicine

## 2020-03-10 LAB — THYROID PANEL WITH TSH
Free Thyroxine Index: 2.1 (ref 1.2–4.9)
T3 Uptake Ratio: 27 % (ref 24–39)
T4, Total: 7.8 ug/dL (ref 4.5–12.0)
TSH: 2.4 u[IU]/mL (ref 0.450–4.500)

## 2020-03-10 LAB — LIPID PANEL
Chol/HDL Ratio: 2.6 ratio (ref 0.0–4.4)
Cholesterol, Total: 188 mg/dL (ref 100–199)
HDL: 71 mg/dL (ref >39)
LDL Chol Calc (NIH): 97 mg/dL (ref 0–99)
Triglycerides: 113 mg/dL (ref 0–149)
VLDL Cholesterol Cal: 20 mg/dL (ref 5–40)

## 2020-03-10 LAB — CMP14+EGFR
ALT: 18 IU/L (ref 0–32)
AST: 27 IU/L (ref 0–40)
Albumin/Globulin Ratio: 1.7 (ref 1.2–2.2)
Albumin: 4.3 g/dL (ref 3.8–4.9)
Alkaline Phosphatase: 76 IU/L (ref 48–121)
BUN/Creatinine Ratio: 11 (ref 9–23)
BUN: 9 mg/dL (ref 6–24)
Bilirubin Total: 0.5 mg/dL (ref 0.0–1.2)
CO2: 24 mmol/L (ref 20–29)
Calcium: 9.5 mg/dL (ref 8.7–10.2)
Chloride: 97 mmol/L (ref 96–106)
Creatinine, Ser: 0.8 mg/dL (ref 0.57–1.00)
GFR calc Af Amer: 96 mL/min/{1.73_m2} (ref >59)
GFR calc non Af Amer: 83 mL/min/{1.73_m2} (ref >59)
Globulin, Total: 2.5 g/dL (ref 1.5–4.5)
Glucose: 85 mg/dL (ref 65–99)
Potassium: 4.9 mmol/L (ref 3.5–5.2)
Sodium: 133 mmol/L — ABNORMAL LOW (ref 134–144)
Total Protein: 6.8 g/dL (ref 6.0–8.5)

## 2020-03-19 ENCOUNTER — Other Ambulatory Visit: Payer: Self-pay | Admitting: Family Medicine

## 2020-03-19 DIAGNOSIS — Z1231 Encounter for screening mammogram for malignant neoplasm of breast: Secondary | ICD-10-CM

## 2020-04-15 ENCOUNTER — Ambulatory Visit
Admission: RE | Admit: 2020-04-15 | Discharge: 2020-04-15 | Disposition: A | Discharge status: Home / Self Care | Payer: BC Managed Care – PPO | Source: Ambulatory Visit | Attending: Family Medicine | Admitting: Family Medicine

## 2020-04-15 ENCOUNTER — Other Ambulatory Visit: Payer: Self-pay

## 2020-04-15 DIAGNOSIS — Z1231 Encounter for screening mammogram for malignant neoplasm of breast: Secondary | ICD-10-CM

## 2020-04-22 ENCOUNTER — Encounter: Payer: Self-pay | Admitting: Family Medicine

## 2020-04-22 ENCOUNTER — Ambulatory Visit (INDEPENDENT_AMBULATORY_CARE_PROVIDER_SITE_OTHER): Payer: BC Managed Care – PPO | Admitting: Family Medicine

## 2020-04-22 ENCOUNTER — Other Ambulatory Visit (HOSPITAL_COMMUNITY)
Admission: RE | Admit: 2020-04-22 | Discharge: 2020-04-22 | Disposition: A | Discharge status: Home / Self Care | Payer: BC Managed Care – PPO | Source: Ambulatory Visit | Attending: Family Medicine | Admitting: Family Medicine

## 2020-04-22 ENCOUNTER — Other Ambulatory Visit: Payer: Self-pay

## 2020-04-22 VITALS — BP 128/87 | HR 67 | Temp 97.9°F | Ht 65.0 in | Wt 118.0 lb

## 2020-04-22 DIAGNOSIS — Z124 Encounter for screening for malignant neoplasm of cervix: Secondary | ICD-10-CM | POA: Diagnosis not present

## 2020-04-22 DIAGNOSIS — E063 Autoimmune thyroiditis: Secondary | ICD-10-CM | POA: Diagnosis not present

## 2020-04-22 DIAGNOSIS — E038 Other specified hypothyroidism: Secondary | ICD-10-CM | POA: Diagnosis not present

## 2020-04-22 DIAGNOSIS — N952 Postmenopausal atrophic vaginitis: Secondary | ICD-10-CM

## 2020-04-22 MED ORDER — LEVOTHYROXINE SODIUM 75 MCG PO TABS: 75.0000 ug | ORAL_TABLET | Freq: Every day | ORAL | 1 refills | Status: DC

## 2020-04-22 MED ORDER — OSPHENA 60 MG PO TABS: 1.0000 | ORAL_TABLET | Freq: Every day | ORAL | 12 refills | Status: DC

## 2020-04-22 NOTE — Patient Instructions (Addendum)
Ospemifene oral tablets What is this medicine? OSPEMIFENE (os PEM i feen) is used to treat vaginal dryness and painful sexual intercourse due to menopause; these symptoms occur due to changes in and around the vagina. This medicine may be used for other purposes; ask your health care provider or pharmacist if you have questions. COMMON BRAND NAME(S): Osphena What should I tell my health care provider before I take this medicine? They need to know if you have any of these conditions:  cancer, such as breast, uterine, or other cancer  heart disease  history of blood clots  history of stroke  history of vaginal bleeding  liver disease  premenopausal  smoke tobacco  an unusual or allergic reaction to ospemifene, other medicines, foods, dyes, or preservatives  pregnant or trying to get pregnant  breast-feeding How should I use this medicine? Take this medicine by mouth with a glass of water. Take this medicine with food. Follow the directions on the prescription label. Do not take your medicine more often than directed. Do not stop taking except on your doctor's advice. Talk to your pediatrician regarding the use of this medicine in children. This medicine is not approved for use in children. Overdosage: If you think you have taken too much of this medicine contact a poison control center or emergency room at once. NOTE: This medicine is only for you. Do not share this medicine with others. What if I miss a dose? If you miss a dose, take it as soon as you can. If it is almost time for your next dose, take only that dose. Do not take double or extra doses. What may interact with this medicine?  amiodarone  bosentan  carbamazepine  certain medicines for fungal infections like ketoconazole, itraconazole, fluconazole, and voriconazole  certain medicines for HIV or  hepatitis  estrogens  glyburide  griseofulvin  mitotane  modafinil  omeprazole  phenobarbital  phenytoin  primidone  raloxifene  rifampin  St. John's wort  tamoxifen  toremifene  warfarin This list may not describe all possible interactions. Give your health care provider a list of all the medicines, herbs, non-prescription drugs, or dietary supplements you use. Also tell them if you smoke, drink alcohol, or use illegal drugs. Some items may interact with your medicine. What should I watch for while using this medicine? Visit your doctor or health care professional for regular checks on your progress. You will need a regular breast and pelvic exam and Pap smear while on this medicine. You should also discuss the need for regular mammograms with your health care professional, and follow his or her guidelines for these tests. Smoking increases the risk of getting a blood clot or having a stroke while you are taking this medicine. You are strongly advised not to smoke. This medicine does not prevent hot flashes. It may cause hot flashes in some patients. This medicine can increase the risk of developing a condition (endometrial hyperplasia) that may lead to cancer of the lining of the uterus. Taking progestins, another hormone drug, with this medicine lowers the risk of developing this condition. Therefore, if your uterus has not been removed (by a hysterectomy), your doctor may prescribe a progestin for you to take with this medicine. You should know, however, that taking a progestin may have additional health risks. You should discuss the use of these medicines with your health care professional to determine the benefits and risks for you. If you are going to have surgery, you may need to   stop taking this medicine. Consult your health care professional for advice before you schedule the surgery. If you have any reason to think you are pregnant; stop taking this medicine at once  and contact your doctor or health care professional. What side effects may I notice from receiving this medicine? Side effects that you should report to your doctor or health care professional as soon as possible:  allergic reactions like skin rash, itching or hives; swelling of the face, lips, or tongue  breathing problems  breast tissue changes or discharge  signs and symptoms of a blood clot such as chest pain; shortness of breath; pain, swelling, or warmth in the leg  signs and symptoms of a stroke like changes in vision; confusion; trouble speaking or understanding; severe headaches; sudden numbness or weakness of the face, arm or leg; trouble walking; dizziness; loss of balance or coordination  vaginal bleeding Side effects that usually do not require medical attention (report to your doctor or health care professional if they continue or are bothersome):  hot flushes or flashes  increased sweating  muscle cramps  vaginal discharge (white or clear) This list may not describe all possible side effects. Call your doctor for medical advice about side effects. You may report side effects to FDA at 1-800-FDA-1088. Where should I keep my medicine? Keep out of the reach of children. Store at room temperature between 20 and 25 degrees C (68 and 77 degrees F). Protect from light. Keep container tightly closed. Throw away any unused medicine after the expiration date. NOTE: This sheet is a summary. It may not cover all possible information. If you have questions about this medicine, talk to your doctor, pharmacist, or health care provider.  2020 Elsevier/Gold Standard (2017-10-23 13:06:09)   Pap Test Why am I having this test? A Pap test, also called a Pap smear, is a screening test to check for signs of:  Cancer of the vagina, cervix, and uterus. The cervix is the lower part of the uterus that opens into the vagina.  Infection.  Changes that may be a sign that cancer is  developing (precancerous changes). Women need this test on a regular basis. In general, you should have a Pap test every 3 years until you reach menopause or age 25. Women aged 30-60 may choose to have their Pap test done at the same time as an HPV (human papillomavirus) test every 5 years (instead of every 3 years). Your health care provider may recommend having Pap tests more or less often depending on your medical conditions and past Pap test results. What kind of sample is taken?  Your health care provider will collect a sample of cells from the surface of your cervix. This will be done using a small cotton swab, plastic spatula, or brush. This sample is often collected during a pelvic exam, when you are lying on your back on an exam table with feet in footrests (stirrups). In some cases, fluids (secretions) from the cervix or vagina may also be collected. How do I prepare for this test?  Be aware of where you are in your menstrual cycle. If you are menstruating on the day of the test, you may be asked to reschedule.  You may need to reschedule if you have a known vaginal infection on the day of the test.  Follow instructions from your health care provider about: ? Changing or stopping your regular medicines. Some medicines can cause abnormal test results, such as digitalis and tetracycline. ?  Avoiding douching or taking a bath the day before or the day of the test. Tell a health care provider about:  Any allergies you have.  All medicines you are taking, including vitamins, herbs, eye drops, creams, and over-the-counter medicines.  Any blood disorders you have.  Any surgeries you have had.  Any medical conditions you have.  Whether you are pregnant or may be pregnant. How are the results reported? Your test results will be reported as either abnormal or normal. A false-positive result can occur. A false positive is incorrect because it means that a condition is present when it is  not. A false-negative result can occur. A false negative is incorrect because it means that a condition is not present when it is. What do the results mean? A normal test result means that you do not have signs of cancer of the vagina, cervix, or uterus. An abnormal result may mean that you have:  Cancer. A Pap test by itself is not enough to diagnose cancer. You will have more tests done in this case.  Precancerous changes in your vagina, cervix, or uterus.  Inflammation of the cervix.  An STD (sexually transmitted disease).  A fungal infection.  A parasite infection. Talk with your health care provider about what your results mean. Questions to ask your health care provider Ask your health care provider, or the department that is doing the test:  When will my results be ready?  How will I get my results?  What are my treatment options?  What other tests do I need?  What are my next steps? Summary  In general, women should have a Pap test every 3 years until they reach menopause or age 78.  Your health care provider will collect a sample of cells from the surface of your cervix. This will be done using a small cotton swab, plastic spatula, or brush.  In some cases, fluids (secretions) from the cervix or vagina may also be collected. This information is not intended to replace advice given to you by your health care provider. Make sure you discuss any questions you have with your health care provider. Document Revised: 05/15/2017 Document Reviewed: 05/15/2017 Elsevier Patient Education  2020 [employer].

## 2020-04-22 NOTE — Progress Notes (Signed)
Subjective: CC: pap smear PCP: Raliegh Ip, DO April Herrera is a 54 y.o. female presenting to clinic today for:  1. Screen for cervical cancer Patient here for Pap smear.  She had a mammogram and this was normal.  She needs a refill on Synthroid but everything has been stable from her last visit with regards to that.  She has experienced vaginal dryness since menopause.  She is reluctant to use any hormones due to risk of cancers.  She has no personal history of cancers but there is a family history of various cancers that are nonbreast cancers.  She is currently using lubrication with intercourse.  Denies any postcoital bleeding, pain with intercourse, pelvic pain or abnormal vaginal discharge  ROS: Per HPI  Allergies  Allergen Reactions  . Sulfa Antibiotics    Past Medical History:  Diagnosis Date  . Hypotension   . Osteopenia   . Thyroid disease    hypothyroidism    Current Outpatient Medications:  .  Calcium Citrate-Vitamin D (CALCIUM + D PO), Take by mouth., Disp: , Rfl:  .  Fish Oil-Cholecalciferol (FISH OIL + D3 PO), Take by mouth. (Patient not taking: Reported on 03/09/2020), Disp: , Rfl:  .  fluticasone (FLONASE) 50 MCG/ACT nasal spray, Use 2 sprays in each nostril once daily (Patient taking differently: as needed. ), Disp: 16 g, Rfl: 4 .  levothyroxine (SYNTHROID) 75 MCG tablet, Take 1 tablet (75 mcg total) by mouth daily., Disp: 90 tablet, Rfl: 1 .  LYSINE PO, Take by mouth daily., Disp: , Rfl:  .  Multiple Vitamins-Minerals (MULTI COMPLETE PO), Take by mouth., Disp: , Rfl:  .  XIIDRA 5 % SOLN, place 1 drop into each eye twice daily, Disp: , Rfl: 3 Social History   Socioeconomic History  . Marital status: Married    Spouse name: Not on file  . Number of children: Not on file  . Years of education: Not on file  . Highest education level: Not on file  Occupational History  . Not on file  Tobacco Use  . Smoking status: Former Smoker    Types:  Cigarettes  . Smokeless tobacco: Never Used  Vaping Use  . Vaping Use: Never used  Substance and Sexual Activity  . Alcohol use: Yes    Alcohol/week: 3.0 standard drinks    Types: 3 Cans of beer per week  . Drug use: No  . Sexual activity: Not on file  Other Topics Concern  . Not on file  Social History Narrative  . Not on file   Social Determinants of Health   Financial Resource Strain:   . Difficulty of Paying Living Expenses:   Food Insecurity:   . Worried About Programme researcher, broadcasting/film/video in the Last Year:   . Barista in the Last Year:   Transportation Needs:   . Freight forwarder (Medical):   Marland Kitchen Lack of Transportation (Non-Medical):   Physical Activity:   . Days of Exercise per Week:   . Minutes of Exercise per Session:   Stress:   . Feeling of Stress :   Social Connections:   . Frequency of Communication with Friends and Family:   . Frequency of Social Gatherings with Friends and Family:   . Attends Religious Services:   . Active Member of Clubs or Organizations:   . Attends Banker Meetings:   Marland Kitchen Marital Status:   Intimate Partner Violence:   . Fear of Current or  Ex-Partner:   . Emotionally Abused:   Marland Kitchen Physically Abused:   . Sexually Abused:    Family History  Problem Relation Age of Onset  . Breast cancer Mother   . Cancer Father        colon cancer  . Hypothyroidism Sister   . Hashimoto's thyroiditis Son   . Breast cancer Maternal Aunt     Objective: Office vital signs reviewed. BP 128/87   Pulse 67   Temp 97.9 F (36.6 C)   Ht 5\' 5"  (1.651 m)   Wt 118 lb (53.5 kg)   LMP 01/27/2012   SpO2 99%   BMI 19.64 kg/m   Physical Examination:  General: Awake, alert, well nourished, No acute distress HEENT: Normal, sclera white Pulm:  normal work of breathing on room air Extremities: warm, well perfused, No edema, cyanosis or clubbing; +2 pulses bilaterally MSK: normal gait and station GU: External vagina atrophic.  No vaginal  lesions.  Cervix is midline.  No punctate lesions.  No CMT.  No vaginal discharge or bleeding.  Assessment/ Plan: 54 y.o. female   1. Screening for malignant neoplasm of cervix Pap performed.  Okay to repeat in 3 to 5 years pending results - Cytology - PAP  2. Post-menopausal atrophic vaginitis We discussed topical estrogens versus Osphena.  Both have relatively low risk of estrogen mediated cancers but I discussed that still not 0 risk.  She would like to try Osphena.  This has been sent to pharmacy.  She will follow-up as needed on this issue. - Ospemifene (OSPHENA) 60 MG TABS; Take 1 tablet by mouth daily.  Dispense: 30 tablet; Refill: 12  3. Hypothyroidism due to Hashimoto's thyroiditis - levothyroxine (SYNTHROID) 75 MCG tablet; Take 1 tablet (75 mcg total) by mouth daily.  Dispense: 90 tablet; Refill: 1   No orders of the defined types were placed in this encounter.  No orders of the defined types were placed in this encounter.    57, DO [facility] [phone]

## 2020-05-06 LAB — CYTOLOGY - PAP
Comment: NEGATIVE
Diagnosis: UNDETERMINED — AB
High risk HPV: NEGATIVE

## 2020-06-15 ENCOUNTER — Other Ambulatory Visit: Payer: Self-pay

## 2020-06-15 ENCOUNTER — Ambulatory Visit: Payer: BC Managed Care – PPO | Admitting: Nurse Practitioner

## 2020-06-15 ENCOUNTER — Encounter: Payer: Self-pay | Admitting: Nurse Practitioner

## 2020-06-15 VITALS — BP 138/80 | HR 71 | Temp 97.6°F | Ht 65.0 in | Wt 119.8 lb

## 2020-06-15 DIAGNOSIS — L309 Dermatitis, unspecified: Secondary | ICD-10-CM | POA: Diagnosis not present

## 2020-06-15 MED ORDER — TRIAMCINOLONE ACETONIDE 40 MG/ML IJ SUSP
40.0000 mg | Freq: Once | INTRAMUSCULAR | Status: AC
  Administered 2020-06-15: 40 mg via INTRAMUSCULAR

## 2020-06-15 MED ORDER — PREDNISONE 10 MG (21) PO TBPK
ORAL_TABLET | ORAL | 0 refills | Status: DC
Start: 2020-06-15 — End: 2021-07-26

## 2020-06-15 NOTE — Progress Notes (Signed)
Acute Office Visit  Subjective:    Patient ID: April Herrera, female    DOB: 11-May-1966, 54 y.o.   MRN: [id number]  Chief Complaint  Patient presents with  . Rash    Patient states she has a rash on her right side.    Rash This is a new problem. The current episode started yesterday. The affected locations include the right shoulder, right arm and abdomen. The rash is characterized by redness and itchiness. Pertinent negatives include no cough, facial edema, fatigue, fever, shortness of breath or sore throat.    Past Medical History:  Diagnosis Date  . Hypotension   . Osteopenia   . Thyroid disease    hypothyroidism    Past Surgical History:  Procedure Laterality Date  . arm surgery Right    age 61   . BUNIONECTOMY Right   . TONSILLECTOMY     age 71    Family History  Problem Relation Age of Onset  . Breast cancer Mother   . Cancer Father        colon cancer  . Hypothyroidism Sister   . Hashimoto's thyroiditis Son   . Breast cancer Maternal Aunt       Outpatient Medications Prior to Visit  Medication Sig Dispense Refill  . Calcium Citrate-Vitamin D (CALCIUM + D PO) Take by mouth.    . Fish Oil-Cholecalciferol (FISH OIL + D3 PO) Take by mouth.     . fluticasone (FLONASE) 50 MCG/ACT nasal spray Use 2 sprays in each nostril once daily (Patient taking differently: as needed. ) 16 g 4  . levothyroxine (SYNTHROID) 75 MCG tablet Take 1 tablet (75 mcg total) by mouth daily. 90 tablet 1  . LYSINE PO Take by mouth daily.    . Multiple Vitamins-Minerals (MULTI COMPLETE PO) Take by mouth.    . Ospemifene (OSPHENA) 60 MG TABS Take 1 tablet by mouth daily. 30 tablet 12  . XIIDRA 5 % SOLN place 1 drop into each eye twice daily  3   No facility-administered medications prior to visit.    Allergies  Allergen Reactions  . Sulfa Antibiotics     Review of Systems  Constitutional: Negative for fatigue and fever.  HENT: Negative for sore throat.   Respiratory:  Negative for cough and shortness of breath.   Skin: Positive for color change and rash.  All other systems reviewed and are negative.      Objective:    Physical Exam Constitutional:      Appearance: Normal appearance.  HENT:     Head: Normocephalic.     Nose: Nose normal.  Eyes:     Conjunctiva/sclera: Conjunctivae normal.  Cardiovascular:     Rate and Rhythm: Normal rate and regular rhythm.     Pulses: Normal pulses.     Heart sounds: Normal heart sounds.  Pulmonary:     Effort: Pulmonary effort is normal.     Breath sounds: Normal breath sounds.  Abdominal:     General: Bowel sounds are normal.  Musculoskeletal:        General: Normal range of motion.  Skin:    General: Skin is warm.     Findings: Erythema and rash present.  Neurological:     Mental Status: She is alert and oriented to person, place, and time.  Psychiatric:        Mood and Affect: Mood normal.        Behavior: Behavior normal.     BP 138/80  Pulse 71   Temp 97.6 F (36.4 C) (Temporal)   Ht 5\' 5"  (1.651 m)   Wt 119 lb 12.8 oz (54.3 kg)   LMP 01/27/2012   SpO2 98%   BMI 19.94 kg/m  Wt Readings from Last 3 Encounters:  06/15/20 119 lb 12.8 oz (54.3 kg)  04/22/20 118 lb (53.5 kg)  03/09/20 120 lb (54.4 kg)    Health Maintenance Due  Topic Date Due  . INFLUENZA VACCINE  04/19/2020  . COVID-19 Vaccine (2 - Moderna 2-dose series) 05/16/2020     Lab Results  Component Value Date   TSH 2.400 03/09/2020   Lab Results  Component Value Date   WBC 10.1 04/11/2018   HGB 14.0 04/11/2018   HCT 44.1 04/11/2018   MCV 94 04/11/2018   PLT 286 04/11/2018   Lab Results  Component Value Date   NA 133 (L) 03/09/2020   K 4.9 03/09/2020   CO2 24 03/09/2020   GLUCOSE 85 03/09/2020   BUN 9 03/09/2020   CREATININE 0.80 03/09/2020   BILITOT 0.5 03/09/2020   ALKPHOS 76 03/09/2020   AST 27 03/09/2020   ALT 18 03/09/2020   PROT 6.8 03/09/2020   ALBUMIN 4.3 03/09/2020   CALCIUM 9.5 03/09/2020    Lab Results  Component Value Date   CHOL 188 03/09/2020   Lab Results  Component Value Date   HDL 71 03/09/2020   Lab Results  Component Value Date   LDLCALC 97 03/09/2020   Lab Results  Component Value Date   TRIG 113 03/09/2020   Lab Results  Component Value Date   CHOLHDL 2.6 03/09/2020   No results found for: HGBA1C     Assessment & Plan:   Problem List Items Addressed This Visit      Musculoskeletal and Integument   Dermatitis - Primary    Patient is a 54 year old female who is in clinic for dermatitis.  Patient reports she was in her garden and later that evening she developed rashes.  Patient reports this is new.  Patient has tried over-the-counter antiitch cream no therapeutic effect.  Patient is reporting rash is not well managed and spreading along her trunk, abdomen, right arm, armpits and shoulders.  Patient denies any cough, facial edema, fatigue, fever, shortness of breath or sore throat. Started patient on prednisone pack. Kenalog shot given in office. Advised patient to use cool compress, avoid scratching nails to avoid infection to the skin. Advised patient to use over-the-counter Benadryl antiitch cream. Rx sent to pharmacy. Follow-up with worsening or unresolved symptoms          Meds ordered this encounter  Medications  . predniSONE (STERAPRED UNI-PAK 21 TAB) 10 MG (21) TBPK tablet    Sig: 6 tablets by mouth day 1, 5 tablet day 2, 4 tablet day 3, 3 tablet day 4, 1 tablet 5    Dispense:  1 each    Refill:  0    Order Specific Question:   Supervising Provider    Answer:   59 A Arville Care  . triamcinolone acetonide (KENALOG-40) injection 40 mg     [id number], NP

## 2020-06-15 NOTE — Patient Instructions (Signed)
Rash, Adult  A rash is a change in the color of your skin. A rash can also change the way your skin feels. There are many different conditions and factors that can cause a rash. Follow these instructions at home: The goal of treatment is to stop the itching and keep the rash from spreading. Watch for any changes in your symptoms. Let your doctor know about them. Follow these instructions to help with your condition: Medicine Take or apply over-the-counter and prescription medicines only as told by your doctor. These may include medicines:  To treat red or swollen skin (corticosteroid creams).  To treat itching.  To treat an allergy (oral antihistamines).  To treat very bad symptoms (oral corticosteroids).  Skin care  Put cool cloths (compresses) on the affected areas.  Do not scratch or rub your skin.  Avoid covering the rash. Make sure that the rash is exposed to air as much as possible. Managing itching and discomfort  Avoid hot showers or baths. These can make itching worse. A cold shower may help.  Try taking a bath with: ? Epsom salts. You can get these at your local pharmacy or grocery store. Follow the instructions on the package. ? Baking soda. Pour a small amount into the bath as told by your doctor. ? Colloidal oatmeal. You can get this at your local pharmacy or grocery store. Follow the instructions on the package.  Try putting baking soda paste onto your skin. Stir water into baking soda until it gets like a paste.  Try putting on a lotion that relieves itchiness (calamine lotion).  Keep cool and out of the sun. Sweating and being hot can make itching worse. General instructions   Rest as needed.  Drink enough fluid to keep your pee (urine) pale yellow.  Wear loose-fitting clothing.  Avoid scented soaps, detergents, and perfumes. Use gentle soaps, detergents, perfumes, and other cosmetic products.  Avoid anything that causes your rash. Keep a journal to  help track what causes your rash. Write down: ? What you eat. ? What cosmetic products you use. ? What you drink. ? What you wear. This includes jewelry.  Keep all follow-up visits as told by your doctor. This is important. Contact a doctor if:  You sweat at night.  You lose weight.  You pee (urinate) more than normal.  You pee less than normal, or you notice that your pee is a darker color than normal.  You feel weak.  You throw up (vomit).  Your skin or the whites of your eyes look yellow (jaundice).  Your skin: ? Tingles. ? Is numb.  Your rash: ? Does not go away after a few days. ? Gets worse.  You are: ? More thirsty than normal. ? More tired than normal.  You have: ? New symptoms. ? Pain in your belly (abdomen). ? A fever. ? Watery poop (diarrhea). Get help right away if:  You have a fever and your symptoms suddenly get worse.  You start to feel mixed up (confused).  You have a very bad headache or a stiff neck.  You have very bad joint pains or stiffness.  You have jerky movements that you cannot control (seizure).  Your rash covers all or most of your body. The rash may or may not be painful.  You have blisters that: ? Are on top of the rash. ? Grow larger. ? Grow together. ? Are painful. ? Are inside your nose or mouth.  You have a rash   that: ? Looks like purple pinprick-sized spots all over your body. ? Has a "bull's eye" or looks like a target. ? Is red and painful, causes your skin to peel, and is not from being in the sun too long. Summary  A rash is a change in the color of your skin. A rash can also change the way your skin feels.  The goal of treatment is to stop the itching and keep the rash from spreading.  Take or apply over-the-counter and prescription medicines only as told by your doctor.  Contact a doctor if you have new symptoms or symptoms that get worse.  Keep all follow-up visits as told by your doctor. This is  important. This information is not intended to replace advice given to you by your health care provider. Make sure you discuss any questions you have with your health care provider. Document Revised: 12/28/2018 Document Reviewed: 04/09/2018 Elsevier Patient Education  2020 [employer]  

## 2020-06-15 NOTE — Assessment & Plan Note (Signed)
Patient is a 54 year old female who is in clinic for dermatitis.  Patient reports she was in her garden and later that evening she developed rashes.  Patient reports this is new.  Patient has tried over-the-counter antiitch cream no therapeutic effect.  Patient is reporting rash is not well managed and spreading along her trunk, abdomen, right arm, armpits and shoulders.  Patient denies any cough, facial edema, fatigue, fever, shortness of breath or sore throat. Started patient on prednisone pack. Kenalog shot given in office. Advised patient to use cool compress, avoid scratching nails to avoid infection to the skin. Advised patient to use over-the-counter Benadryl antiitch cream. Rx sent to pharmacy. Follow-up with worsening or unresolved symptoms

## 2020-10-07 ENCOUNTER — Other Ambulatory Visit: Payer: Self-pay | Admitting: *Deleted

## 2020-10-07 ENCOUNTER — Encounter: Payer: Self-pay | Admitting: Family Medicine

## 2020-10-07 ENCOUNTER — Telehealth: Payer: Self-pay

## 2020-10-07 DIAGNOSIS — Z01812 Encounter for preprocedural laboratory examination: Secondary | ICD-10-CM

## 2020-10-07 DIAGNOSIS — Z1152 Encounter for screening for COVID-19: Secondary | ICD-10-CM

## 2020-10-07 NOTE — Telephone Encounter (Signed)
If we are allowed to do them for her procedure, ok with me.  I know that some of the GI/ Surgical offices were strict on getting them at certain sites at one point but I'm ok with her getting it here if they are

## 2020-10-07 NOTE — Telephone Encounter (Signed)
TEST ORDERED, COMING Friday 10/09/2020 TO TEST SO RESULTS WILL BE BACK IN TIME FRAME NEEDED FOR PROCEDURE

## 2020-10-21 ENCOUNTER — Ambulatory Visit: Payer: BC Managed Care – PPO | Admitting: Family Medicine

## 2020-10-26 ENCOUNTER — Other Ambulatory Visit: Payer: Self-pay

## 2020-10-26 ENCOUNTER — Encounter: Payer: Self-pay | Admitting: Family Medicine

## 2020-10-26 ENCOUNTER — Ambulatory Visit (INDEPENDENT_AMBULATORY_CARE_PROVIDER_SITE_OTHER): Payer: PRIVATE HEALTH INSURANCE | Admitting: Family Medicine

## 2020-10-26 VITALS — BP 107/64 | HR 66 | Temp 97.7°F | Ht 65.0 in | Wt 119.0 lb

## 2020-10-26 DIAGNOSIS — G4700 Insomnia, unspecified: Secondary | ICD-10-CM | POA: Diagnosis not present

## 2020-10-26 DIAGNOSIS — E063 Autoimmune thyroiditis: Secondary | ICD-10-CM | POA: Diagnosis not present

## 2020-10-26 DIAGNOSIS — E038 Other specified hypothyroidism: Secondary | ICD-10-CM | POA: Diagnosis not present

## 2020-10-26 DIAGNOSIS — F4321 Adjustment disorder with depressed mood: Secondary | ICD-10-CM | POA: Diagnosis not present

## 2020-10-26 NOTE — Progress Notes (Signed)
Subjective: CC: Hypothyroidism PCP: April Ip, DO PYK:DXIPJA [name] is a 55 y.o. female presenting to clinic today for:  1.  Hypothyroidism/insomnia/grief Patient notes that she is been feeling somewhat unusual for the last several weeks.  She has had some issues with her sleep, particularly after she has slept for 3 hours, which is quality hours but thereafter she has poor quality sleep.  This is because of excessive daytime fatigue.  Denies any sleep apnea symptoms.  Symptoms seem to onset after she had a colonoscopy.  Prior to that time she was fairly constipated but now she is having loose daily stools.  No hematochezia or melena.  She has had a little bit of weight loss despite adequate intake.  She reports no tremors, no heart palpitations, no change in voice.    She reports the passing of her mother about 3 months ago (she passed away from complications of April Herrera).  She is also since quit her previous job.  She is supposed to start a new job April Herrera.  For her mood she has been using a natural supplement over-the-counter prescribed to her by her chiropractor.  She uses only as needed.  She has been journaling.   ROS: Per HPI  Allergies  Allergen Reactions  . Sulfa Antibiotics    Past Medical History:  Diagnosis Date  . Hypotension   . Osteopenia   . Thyroid disease    hypothyroidism    Current Outpatient Medications:  .  Calcium Citrate-Vitamin April (CALCIUM + April PO), Take by mouth., Disp: , Rfl:  .  Fish Oil-Cholecalciferol (FISH OIL + D3 PO), Take by mouth. , Disp: , Rfl:  .  fluticasone (FLONASE) 50 MCG/ACT nasal spray, Use 2 sprays in each nostril once daily (Patient taking differently: as needed. ), Disp: 16 g, Rfl: 4 .  levothyroxine (SYNTHROID) 75 MCG tablet, Take 1 tablet (75 mcg total) by mouth daily., Disp: 90 tablet, Rfl: 1 .  LYSINE PO, Take by mouth daily., Disp: , Rfl:  .  Multiple Vitamins-Minerals (MULTI  COMPLETE PO), Take by mouth., Disp: , Rfl:  .  Ospemifene (OSPHENA) 60 MG TABS, Take 1 tablet by mouth daily., Disp: 30 tablet, Rfl: 12 .  predniSONE (STERAPRED UNI-PAK 21 TAB) 10 MG (21) TBPK tablet, 6 tablets by mouth day 1, 5 tablet day 2, 4 tablet day 3, 3 tablet day 4, 1 tablet 5, Disp: 1 each, Rfl: 0 .  XIIDRA 5 % SOLN, place 1 drop into each eye twice daily, Disp: , Rfl: 3 Social History   Socioeconomic History  . Marital status: Married    Spouse name: Not on file  . Number of children: Not on file  . Years of education: Not on file  . Highest education level: Not on file  Occupational History  . Not on file  Tobacco Use  . Smoking status: Former Smoker    Types: Cigarettes  . Smokeless tobacco: Never Used  Vaping Use  . Vaping Use: Never used  Substance and Sexual Activity  . Alcohol use: Yes    Alcohol/week: 3.0 standard drinks    Types: 3 Cans of beer per week  . Drug use: No  . Sexual activity: Not on file  Other Topics Concern  . Not on file  Social History Narrative  . Not on file   Social Determinants of Health   Financial Resource Strain: Not on file  Food Insecurity: Not on file  Transportation Needs: Not on file  Physical Activity: Not on file  Stress: Not on file  Social Connections: Not on file  Intimate Partner Violence: Not on file   Family History  Problem Relation Age of Onset  . Breast cancer Mother   . Cancer Father        colon cancer  . Hypothyroidism Sister   . Hashimoto's thyroiditis Son   . Breast cancer Maternal Aunt     Objective: Office vital signs reviewed. BP 107/64   Pulse 66   Temp 97.7 F (36.5 C) (Temporal)   Ht 5\' 5"  (1.651 m)   Wt 119 lb (54 kg)   LMP 01/27/2012   SpO2 100%   BMI 19.80 kg/m   Physical Examination:  General: Awake, alert, well nourished, No acute distress HEENT: Normal; sclera white.  No goiter exophthalmos Cardio: regular rate and rhythm, S1S2 heard, no murmurs appreciated Pulm: clear to  auscultation bilaterally, no wheezes, rhonchi or rales; normal work of breathing on room air Extremities: warm, well perfused, No edema, cyanosis or clubbing; +2 pulses bilaterally MSK: Normal gait and station Skin: dry; intact; no rashes or lesions Neuro: No tremor Psych: Mood is stable.  Speech is normal.  Affect appropriate.  Assessment/ Plan: 55 y.o. female   Hypothyroidism due to Hashimoto's thyroiditis - Plan: Thyroid Panel With TSH  Grief reaction  Insomnia, unspecified type  Check thyroid panel to ensure that this is not a metabolic issue.  She is grieving and I have concerns about this potentially impacting her symptoms.  However, she seems to be managing quite well independently.  We talked about naturopathic ways to gain better quality sleep.  She will attempt these as she does not really want to go on extra medications  I encouraged her to contact me should any changes occur  No orders of the defined types were placed in this encounter.  No orders of the defined types were placed in this encounter.    57, DO [facility] [phone]

## 2020-10-27 ENCOUNTER — Encounter: Payer: Self-pay | Admitting: Family Medicine

## 2020-10-27 ENCOUNTER — Other Ambulatory Visit: Payer: Self-pay | Admitting: Family Medicine

## 2020-10-27 DIAGNOSIS — E038 Other specified hypothyroidism: Secondary | ICD-10-CM

## 2020-10-27 LAB — THYROID PANEL WITH TSH
Free Thyroxine Index: 1.9 (ref 1.2–4.9)
T3 Uptake Ratio: 26 % (ref 24–39)
T4, Total: 7.4 ug/dL (ref 4.5–12.0)
TSH: 7.81 u[IU]/mL — ABNORMAL HIGH (ref 0.450–4.500)

## 2020-10-27 MED ORDER — LEVOTHYROXINE SODIUM 88 MCG PO TABS: 88.0000 ug | ORAL_TABLET | Freq: Every day | ORAL | 0 refills | Status: DC

## 2020-12-11 ENCOUNTER — Other Ambulatory Visit: Payer: PRIVATE HEALTH INSURANCE

## 2020-12-11 ENCOUNTER — Other Ambulatory Visit: Payer: Self-pay

## 2020-12-11 DIAGNOSIS — E063 Autoimmune thyroiditis: Secondary | ICD-10-CM

## 2020-12-11 DIAGNOSIS — E038 Other specified hypothyroidism: Secondary | ICD-10-CM

## 2020-12-12 LAB — THYROID PANEL WITH TSH
Free Thyroxine Index: 2.4 (ref 1.2–4.9)
T3 Uptake Ratio: 29 % (ref 24–39)
T4, Total: 8.2 ug/dL (ref 4.5–12.0)
TSH: 1.1 u[IU]/mL (ref 0.450–4.500)

## 2020-12-29 ENCOUNTER — Encounter: Payer: Self-pay | Admitting: Family Medicine

## 2020-12-29 DIAGNOSIS — N952 Postmenopausal atrophic vaginitis: Secondary | ICD-10-CM

## 2021-01-04 MED ORDER — OSPHENA 60 MG PO TABS: 1.0000 | ORAL_TABLET | Freq: Every day | ORAL | 3 refills | Status: DC

## 2021-02-21 ENCOUNTER — Other Ambulatory Visit: Payer: Self-pay | Admitting: Family Medicine

## 2021-02-21 DIAGNOSIS — E038 Other specified hypothyroidism: Secondary | ICD-10-CM

## 2021-02-21 DIAGNOSIS — E063 Autoimmune thyroiditis: Secondary | ICD-10-CM

## 2021-07-26 ENCOUNTER — Encounter: Payer: Self-pay | Admitting: Nurse Practitioner

## 2021-07-26 ENCOUNTER — Ambulatory Visit (INDEPENDENT_AMBULATORY_CARE_PROVIDER_SITE_OTHER): Payer: Self-pay | Admitting: Nurse Practitioner

## 2021-07-26 VITALS — BP 127/74 | HR 64 | Temp 97.9°F

## 2021-07-26 DIAGNOSIS — R21 Rash and other nonspecific skin eruption: Secondary | ICD-10-CM | POA: Insufficient documentation

## 2021-07-26 DIAGNOSIS — R509 Fever, unspecified: Secondary | ICD-10-CM | POA: Insufficient documentation

## 2021-07-26 MED ORDER — PREDNISONE 10 MG (21) PO TBPK: ORAL_TABLET | ORAL | 0 refills | Status: DC

## 2021-07-26 MED ORDER — HYDROCORTISONE 1 % EX LOTN: 1.0000 "application " | TOPICAL_LOTION | Freq: Two times a day (BID) | CUTANEOUS | 0 refills | Status: DC

## 2021-07-26 NOTE — Patient Instructions (Signed)
Influenza, Adult Influenza, also called "the flu," is a viral infection that mainly affects the respiratory tract. This includes the lungs, nose, and throat. The flu spreads easily from person to person (is contagious). It causes common cold symptoms, along with high fever and body aches. What are the causes? This condition is caused by the influenza virus. You can get the virus by: Breathing in droplets that are in the air from an infected person's cough or sneeze. Touching something that has the virus on it (has been contaminated) and then touching your mouth, nose, or eyes. What increases the risk? The following factors may make you more likely to get the flu: Not washing or sanitizing your hands often. Having close contact with many people during cold and flu season. Touching your mouth, eyes, or nose without first washing or sanitizing your hands. Not getting an annual flu shot. You may have a higher risk for the flu, including serious problems, such as a lung infection (pneumonia), if you: Are older than 65. Are pregnant. Have a weakened disease-fighting system (immune system). This includes people who have HIV or AIDS, are on chemotherapy, or are taking medicines that reduce (suppress) the immune system. Have a long-term (chronic) illness, such as heart disease, kidney disease, diabetes, or lung disease. Have a liver disorder. Are severely overweight (morbidly obese). Have anemia. Have asthma. What are the signs or symptoms? Symptoms of this condition usually begin suddenly and last 4-14 days. These may include: Fever and chills. Headaches, body aches, or muscle aches. Sore throat. Cough. Runny or stuffy (congested) nose. Chest discomfort. Poor appetite. Weakness or fatigue. Dizziness. Nausea or vomiting. How is this diagnosed? This condition may be diagnosed based on: Your symptoms and medical history. A physical exam. Swabbing your nose or throat and testing the fluid  for the influenza virus. How is this treated? If the flu is diagnosed early, you can be treated with antiviral medicine that is given by mouth (orally) or through an IV. This can help reduce how severe the illness is and how long it lasts. Taking care of yourself at home can help relieve symptoms. Your health care provider may recommend: Taking over-the-counter medicines. Drinking plenty of fluids. In many cases, the flu goes away on its own. If you have severe symptoms or complications, you may be treated in a hospital. Follow these instructions at home: Activity Rest as needed and get plenty of sleep. Stay home from work or school as told by your health care provider. Unless you are visiting your health care provider, avoid leaving home until your fever has been gone for 24 hours without taking medicine. Eating and drinking Take an oral rehydration solution (ORS). This is a drink that is sold at pharmacies and retail stores. Drink enough fluid to keep your urine pale yellow. Drink clear fluids in small amounts as you are able. Clear fluids include water, ice chips, fruit juice mixed with water, and low-calorie sports drinks. Eat bland, easy-to-digest foods in small amounts as you are able. These foods include bananas, applesauce, rice, lean meats, toast, and crackers. Avoid drinking fluids that contain a lot of sugar or caffeine, such as energy drinks, regular sports drinks, and soda. Avoid alcohol. Avoid spicy or fatty foods. General instructions   Take over-the-counter and prescription medicines only as told by your health care provider. Use a cool mist humidifier to add humidity to the air in your home. This can make it easier to breathe. When using a cool mist humidifier,  clean it daily. Empty the water and replace it with clean water. Cover your mouth and nose when you cough or sneeze. Wash your hands with soap and water often and for at least 20 seconds, especially after you cough or  sneeze. If soap and water are not available, use alcohol-based hand sanitizer. Keep all follow-up visits. This is important. How is this prevented?  Get an annual flu shot. This is usually available in late summer, fall, or winter. Ask your health care provider when you should get your flu shot. Avoid contact with people who are sick during cold and flu season. This is generally fall and winter. Contact a health care provider if: You develop new symptoms. You have: Chest pain. Diarrhea. A fever. Your cough gets worse. You produce more mucus. You feel nauseous or you vomit. Get help right away if you: Develop shortness of breath or have difficulty breathing. Have skin or nails that turn a bluish color. Have severe pain or stiffness in your neck. Develop a sudden headache or sudden pain in your face or ear. Cannot eat or drink without vomiting. These symptoms may represent a serious problem that is an emergency. Do not wait to see if the symptoms will go away. Get medical help right away. Call your local emergency services (911 in the [place] Do not drive yourself to the hospital. Summary Influenza, also called "the flu," is a viral infection that primarily affects your respiratory tract. Symptoms of the flu usually begin suddenly and last 4-14 days. Getting an annual flu shot is the best way to prevent getting the flu. Stay home from work or school as told by your health care provider. Unless you are visiting your health care provider, avoid leaving home until your fever has been gone for 24 hours without taking medicine. Keep all follow-up visits. This is important. This information is not intended to replace advice given to you by your health care provider. Make sure you discuss any questions you have with your health care provider. Document Revised: 04/24/2020 Document Reviewed: 04/24/2020 Elsevier Patient Education  2022 [employer] Rash, Adult A rash is a change in the color of  your skin. A rash can also change the way your skin feels. There are many different conditions and factors that can cause a rash. Some rashes may disappear after a few days, but some may last for a few weeks. Common causes of rashes include: Viral infections, such as: Colds. Measles. Hand, foot, and mouth disease. Bacterial infections, such as: Scarlet fever. Impetigo. Fungal infections, such as Candida. Allergic reactions to food, medicines, or skin care products. Follow these instructions at home: The goal of treatment is to stop the itching and keep the rash from spreading. Pay attention to any changes in your symptoms. Follow these instructions to help with your condition: Medicine Take or apply over-the-counter and prescription medicines only as told by your health care provider. These may include: Corticosteroid creams to treat red or swollen skin. Anti-itch lotions. Oral allergy medicines (antihistamines). Oral corticosteroids for severe symptoms.  Skin care Apply cool compresses to the affected areas. Do not scratch or rub your skin. Avoid covering the rash. Make sure the rash is exposed to air as much as possible. Managing itching and discomfort Avoid hot showers or baths, which can make itching worse. A cold shower may help. Try taking a bath with: Epsom salts. Follow manufacturer instructions on the packaging. You can get these at your local pharmacy or grocery store. Baking  soda. Pour a small amount into the bath as told by your health care provider. Colloidal oatmeal. Follow manufacturer instructions on the packaging. You can get this at your local pharmacy or grocery store. Try applying baking soda paste to your skin. Stir water into baking soda until it reaches a paste-like consistency. Try applying calamine lotion. This is an over-the-counter lotion that helps to relieve itchiness. Keep cool and out of the sun. Sweating and being hot can make itching worse. General  instructions  Rest as needed. Drink enough fluid to keep your urine pale yellow. Wear loose-fitting clothing. Avoid scented soaps, detergents, and perfumes. Use gentle soaps, detergents, perfumes, and other cosmetic products. Avoid any substance that causes your rash. Keep a journal to help track what causes your rash. Write down: What you eat. What cosmetic products you use. What you drink. What you wear. This includes jewelry. Keep all follow-up visits as told by your health care provider. This is important. Contact a health care provider if: You sweat at night. You lose weight. You urinate more than normal. You urinate less than normal, or you notice that your urine is a darker color than usual. You feel weak. You vomit. Your skin or the whites of your eyes look yellow (jaundice). Your skin: Tingles. Is numb. Your rash: Does not go away after several days. Gets worse. You are: Unusually thirsty. More tired than normal. You have: New symptoms. Pain in your abdomen. A fever. Diarrhea. Get help right away if you: Have a fever and your symptoms suddenly get worse. Develop confusion. Have a severe headache or a stiff neck. Have severe joint pains or stiffness. Have a seizure. Develop a rash that covers all or most of your body. The rash may or may not be painful. Develop blisters that: Are on top of the rash. Grow larger or grow together. Are painful. Are inside your nose or mouth. Develop a rash that: Looks like purple pinprick-sized spots all over your body. Has a "bull's eye" or looks like a target. Is not related to sun exposure, is red and painful, and causes your skin to peel. Summary A rash is a change in the color of your skin. Some rashes disappear after a few days, but some may last for a few weeks. The goal of treatment is to stop the itching and keep the rash from spreading. Take or apply over-the-counter and prescription medicines only as told by your  health care provider. Contact a health care provider if you have new or worsening symptoms. Keep all follow-up visits as told by your health care provider. This is important. This information is not intended to replace advice given to you by your health care provider. Make sure you discuss any questions you have with your health care provider. Document Revised: 12/28/2018 Document Reviewed: 04/09/2018 Elsevier Patient Education  2022 [employer].

## 2021-07-26 NOTE — Assessment & Plan Note (Signed)
Unresolved rash, prednisone taper, avoid hot showers: Apply hydrocortisone cream 1% as needed, follow-up with unresolved worsening symptoms.

## 2021-07-26 NOTE — Progress Notes (Signed)
Acute Office Visit  Subjective:    Patient ID: April Herrera, female    DOB: March 21, 1966, 55 y.o.   MRN: [id number]  Chief Complaint  Patient presents with   Fever   Rash    Rash This is a new problem. The current episode started yesterday. The problem has been gradually worsening since onset. The affected locations include the chest. The rash is characterized by dryness, redness and itchiness. It is unknown if there was an exposure to a precipitant. Associated symptoms include congestion and a fever. Pertinent negatives include no cough.  Fever  This is a new problem. The current episode started yesterday. The problem occurs intermittently. The problem has been unchanged. The maximum temperature noted was 99 to 99.9 F. Associated symptoms include congestion, ear pain and a rash. Pertinent negatives include no abdominal pain, chest pain, coughing, headaches or muscle aches.   Past Medical History:  Diagnosis Date   Hypotension    Osteopenia    Thyroid disease    hypothyroidism    Past Surgical History:  Procedure Laterality Date   arm surgery Right    age 36    BUNIONECTOMY Right    TONSILLECTOMY     age 6    Family History  Problem Relation Age of Onset   Breast cancer Mother    Cancer Father        colon cancer   Hypothyroidism Sister    Hashimoto's thyroiditis Son    Breast cancer Maternal Aunt     Social History   Socioeconomic History   Marital status: Married    Spouse name: Not on file   Number of children: Not on file   Years of education: Not on file   Highest education level: Not on file  Occupational History   Not on file  Tobacco Use   Smoking status: Former    Types: Cigarettes   Smokeless tobacco: Never  Vaping Use   Vaping Use: Never used  Substance and Sexual Activity   Alcohol use: Yes    Alcohol/week: 3.0 standard drinks    Types: 3 Cans of beer per week   Drug use: No   Sexual activity: Not on file  Other Topics Concern   Not  on file  Social History Narrative   Not on file   Social Determinants of Health   Financial Resource Strain: Not on file  Food Insecurity: Not on file  Transportation Needs: Not on file  Physical Activity: Not on file  Stress: Not on file  Social Connections: Not on file  Intimate Partner Violence: Not on file    Outpatient Medications Prior to Visit  Medication Sig Dispense Refill   Calcium Citrate-Vitamin D (CALCIUM + D PO) Take by mouth.     fluticasone (FLONASE) 50 MCG/ACT nasal spray Use 2 sprays in each nostril once daily (Patient taking differently: as needed.) 16 g 4   levothyroxine (SYNTHROID) 88 MCG tablet Take 1 tablet by mouth once daily 90 tablet 2   LYSINE PO Take by mouth daily.     Ospemifene (OSPHENA) 60 MG TABS Take 1 tablet by mouth daily. 90 tablet 3   Quercetin 50 MG TABS 1 tablet     XIIDRA 5 % SOLN place 1 drop into each eye twice daily  3   Multiple Vitamins-Minerals (MULTI COMPLETE PO) Take by mouth. (Patient not taking: Reported on 07/26/2021)     Fish Oil-Cholecalciferol (FISH OIL + D3 PO) Take by mouth.  predniSONE (STERAPRED UNI-PAK 21 TAB) 10 MG (21) TBPK tablet 6 tablets by mouth day 1, 5 tablet day 2, 4 tablet day 3, 3 tablet day 4, 1 tablet 5 1 each 0   No facility-administered medications prior to visit.    Allergies  Allergen Reactions   Sulfa Antibiotics     Review of Systems  Constitutional:  Positive for fever.  HENT:  Positive for congestion and ear pain.   Eyes: Negative.   Respiratory: Negative.  Negative for cough.   Cardiovascular:  Negative for chest pain.  Gastrointestinal:  Negative for abdominal pain.  Genitourinary: Negative.   Skin:  Positive for rash.  Neurological:  Negative for headaches.  All other systems reviewed and are negative.     Objective:    Physical Exam Vitals and nursing note reviewed.  Constitutional:      Appearance: Normal appearance.  HENT:     Head: Normocephalic.     Right Ear: Ear canal  and external ear normal.     Left Ear: Ear canal and external ear normal.     Nose: Nose normal.     Mouth/Throat:     Mouth: Mucous membranes are moist.     Pharynx: Oropharynx is clear.  Eyes:     Conjunctiva/sclera: Conjunctivae normal.  Cardiovascular:     Rate and Rhythm: Normal rate and regular rhythm.     Pulses: Normal pulses.     Heart sounds: Normal heart sounds.  Pulmonary:     Effort: Pulmonary effort is normal.     Breath sounds: Normal breath sounds.  Abdominal:     General: Bowel sounds are normal.  Skin:    General: Skin is warm.     Findings: Erythema and rash present.  Neurological:     Mental Status: She is alert and oriented to person, place, and time.  Psychiatric:        Behavior: Behavior normal.    BP 127/74   Pulse 64   Temp 97.9 F (36.6 C) (Temporal)   LMP 01/27/2012   SpO2 100%  Wt Readings from Last 3 Encounters:  10/26/20 119 lb (54 kg)  06/15/20 119 lb 12.8 oz (54.3 kg)  04/22/20 118 lb (53.5 kg)    Health Maintenance Due  Topic Date Due   Zoster Vaccines- Shingrix (1 of 2) Never done   COVID-19 Vaccine (3 - Booster for Moderna series) 07/11/2020   INFLUENZA VACCINE  04/19/2021   PAP SMEAR-Modifier  04/22/2021    There are no preventive care reminders to display for this patient.   Lab Results  Component Value Date   TSH 1.100 12/11/2020   Lab Results  Component Value Date   WBC 10.1 04/11/2018   HGB 14.0 04/11/2018   HCT 44.1 04/11/2018   MCV 94 04/11/2018   PLT 286 04/11/2018   Lab Results  Component Value Date   NA 133 (L) 03/09/2020   K 4.9 03/09/2020   CO2 24 03/09/2020   GLUCOSE 85 03/09/2020   BUN 9 03/09/2020   CREATININE 0.80 03/09/2020   BILITOT 0.5 03/09/2020   ALKPHOS 76 03/09/2020   AST 27 03/09/2020   ALT 18 03/09/2020   PROT 6.8 03/09/2020   ALBUMIN 4.3 03/09/2020   CALCIUM 9.5 03/09/2020   Lab Results  Component Value Date   CHOL 188 03/09/2020   Lab Results  Component Value Date   HDL 71  03/09/2020   Lab Results  Component Value Date   LDLCALC 97 03/09/2020  Lab Results  Component Value Date   TRIG 113 03/09/2020   Lab Results  Component Value Date   CHOLHDL 2.6 03/09/2020   No results found for: HGBA1C     Assessment & Plan:   Problem List Items Addressed This Visit       Musculoskeletal and Integument   Rash - Primary    Unresolved rash, prednisone taper, avoid hot showers: Apply hydrocortisone cream 1% as needed, follow-up with unresolved worsening symptoms.      Relevant Medications   predniSONE (STERAPRED UNI-PAK 21 TAB) 10 MG (21) TBPK tablet   hydrocortisone 1 % lotion     Other   Fever    Tylenol for pain, completed flu swab results pending.  Increase hydration, follow-up with worsening hours of symptoms.      Relevant Orders   Veritor Flu A/B Waived     Meds ordered this encounter  Medications   predniSONE (STERAPRED UNI-PAK 21 TAB) 10 MG (21) TBPK tablet    Sig: 6 tablets day 1, 5 tablet day 2, 4 tablet day 3, 3 tablet before, 2 tablet day 5, 1 tablet day 6    Dispense:  1 each    Refill:  0    Order Specific Question:   Supervising Provider    Answer:   Standley Brooking   hydrocortisone 1 % lotion    Sig: Apply 1 application topically 2 (two) times daily.    Dispense:  118 mL    Refill:  0    Order Specific Question:   Supervising Provider    Answer:   Mechele Claude [[id number]]     Daryll Drown, NP

## 2021-07-26 NOTE — Assessment & Plan Note (Signed)
Tylenol for pain, completed flu swab results pending.  Increase hydration, follow-up with worsening hours of symptoms.

## 2021-07-27 ENCOUNTER — Encounter (INDEPENDENT_AMBULATORY_CARE_PROVIDER_SITE_OTHER): Payer: Self-pay

## 2021-07-27 LAB — VERITOR FLU A/B WAIVED
Influenza A: NEGATIVE
Influenza B: NEGATIVE

## 2021-07-27 NOTE — Telephone Encounter (Signed)
Checking with lab to see why not resulted

## 2021-08-23 ENCOUNTER — Ambulatory Visit: Payer: Self-pay | Admitting: Family Medicine

## 2021-08-26 ENCOUNTER — Ambulatory Visit: Payer: Self-pay | Admitting: Family Medicine

## 2021-08-27 ENCOUNTER — Encounter: Payer: Self-pay | Admitting: Family Medicine

## 2021-08-27 ENCOUNTER — Ambulatory Visit (INDEPENDENT_AMBULATORY_CARE_PROVIDER_SITE_OTHER): Payer: Self-pay | Admitting: Family Medicine

## 2021-08-27 VITALS — BP 137/81 | HR 61 | Temp 98.1°F | Ht 65.0 in | Wt 121.4 lb

## 2021-08-27 DIAGNOSIS — N760 Acute vaginitis: Secondary | ICD-10-CM

## 2021-08-27 DIAGNOSIS — B9689 Other specified bacterial agents as the cause of diseases classified elsewhere: Secondary | ICD-10-CM

## 2021-08-27 DIAGNOSIS — E038 Other specified hypothyroidism: Secondary | ICD-10-CM

## 2021-08-27 DIAGNOSIS — E063 Autoimmune thyroiditis: Secondary | ICD-10-CM

## 2021-08-27 MED ORDER — CLINDAMYCIN PHOSPHATE 2 % VA CREA: 1.0000 | TOPICAL_CREAM | Freq: Every day | VAGINAL | 0 refills | Status: DC

## 2021-08-27 MED ORDER — LEVOTHYROXINE SODIUM 88 MCG PO TABS: 88.0000 ug | ORAL_TABLET | Freq: Every day | ORAL | 2 refills | Status: AC

## 2021-08-27 NOTE — Progress Notes (Signed)
Subjective: CC: Vaginitis PCP: Raliegh Ip, DO ACZ:YSAYTK L Beirne is a 55 y.o. female presenting to clinic today for:  1.  Vaginitis Patient reports that she has been having symptoms consistent with a bacterial vaginitis for about 1 month now.  She had similar symptoms about 16 years ago which apparently required quite a bit of referral before they can determine that this was in fact a BV that was refractory to metronidazole.  She was prescribed clindamycin vaginally and it totally resolved her symptoms.  She believes that she has a similar type of infection today.  She is sexually active with her spouse but has not been able to engage in intercourse for the last month due to this irritation.  She reports vaginal odor, discharge.  No itching.  No bleeding reported   ROS: Per HPI  Allergies  Allergen Reactions   Sulfa Antibiotics    Past Medical History:  Diagnosis Date   Hypotension    Osteopenia    Thyroid disease    hypothyroidism    Current Outpatient Medications:    Calcium Citrate-Vitamin D (CALCIUM + D PO), Take by mouth., Disp: , Rfl:    clindamycin (CLEOCIN) 2 % vaginal cream, Place 1 Applicatorful vaginally at bedtime for 7 days., Disp: 40 g, Rfl: 0   fluticasone (FLONASE) 50 MCG/ACT nasal spray, Use 2 sprays in each nostril once daily (Patient taking differently: as needed.), Disp: 16 g, Rfl: 4   hydrocortisone 1 % lotion, Apply 1 application topically 2 (two) times daily., Disp: 118 mL, Rfl: 0   LYSINE PO, Take by mouth daily., Disp: , Rfl:    Multiple Vitamins-Minerals (MULTI COMPLETE PO), Take by mouth., Disp: , Rfl:    Ospemifene (OSPHENA) 60 MG TABS, Take 1 tablet by mouth daily., Disp: 90 tablet, Rfl: 3   Quercetin 50 MG TABS, 1 tablet, Disp: , Rfl:    XIIDRA 5 % SOLN, place 1 drop into each eye twice daily, Disp: , Rfl: 3   levothyroxine (SYNTHROID) 88 MCG tablet, Take 1 tablet (88 mcg total) by mouth daily., Disp: 90 tablet, Rfl: 2 Social History    Socioeconomic History   Marital status: Married    Spouse name: Not on file   Number of children: Not on file   Years of education: Not on file   Highest education level: Not on file  Occupational History   Not on file  Tobacco Use   Smoking status: Former    Types: Cigarettes   Smokeless tobacco: Never  Vaping Use   Vaping Use: Never used  Substance and Sexual Activity   Alcohol use: Yes    Alcohol/week: 3.0 standard drinks    Types: 3 Cans of beer per week   Drug use: No   Sexual activity: Not on file  Other Topics Concern   Not on file  Social History Narrative   Not on file   Social Determinants of Health   Financial Resource Strain: Not on file  Food Insecurity: Not on file  Transportation Needs: Not on file  Physical Activity: Not on file  Stress: Not on file  Social Connections: Not on file  Intimate Partner Violence: Not on file   Family History  Problem Relation Age of Onset   Breast cancer Mother    Cancer Father        colon cancer   Hypothyroidism Sister    Hashimoto's thyroiditis Son    Breast cancer Maternal Aunt     Objective:  Office vital signs reviewed. BP 137/81   Pulse 61   Temp 98.1 F (36.7 C)   Ht 5\' 5"  (1.651 m)   Wt 121 lb 6.4 oz (55.1 kg)   LMP 01/27/2012   SpO2 99%   BMI 20.20 kg/m   Physical Examination:  General: Awake, alert, well nourished, No acute distress GU: external vaginal tissue normal in appearance, cervix midline, no punctate lesions on cervix appreciated, moderate white discharge from cervical os, no bleeding, no cervical motion tenderness    Assessment/ Plan: 55 y.o. female   Bacterial vaginosis - Plan: clindamycin (CLEOCIN) 2 % vaginal cream  Hypothyroidism due to Hashimoto's thyroiditis - Plan: levothyroxine (SYNTHROID) 88 MCG tablet  Our lab tech had gone home for the day so I personally reviewed her micro slide and it did show clue cells, which is consistent with BV diagnosis.  Her symptoms have  been refractory to topical Flagyl in the past and therefore we will use clindamycin, which she has responded well to.  Plan for thyroid labs at next visit  No orders of the defined types were placed in this encounter.  Meds ordered this encounter  Medications   clindamycin (CLEOCIN) 2 % vaginal cream    Sig: Place 1 Applicatorful vaginally at bedtime for 7 days.    Dispense:  40 g    Refill:  0   levothyroxine (SYNTHROID) 88 MCG tablet    Sig: Take 1 tablet (88 mcg total) by mouth daily.    Dispense:  90 tablet    Refill:  2     Samyia Motter 56, DO [facility] [phone]

## 2021-09-07 ENCOUNTER — Encounter: Payer: Self-pay | Admitting: Family Medicine

## 2021-09-08 ENCOUNTER — Ambulatory Visit (INDEPENDENT_AMBULATORY_CARE_PROVIDER_SITE_OTHER): Payer: Self-pay | Admitting: Family Medicine

## 2021-09-08 ENCOUNTER — Encounter: Payer: Self-pay | Admitting: Family Medicine

## 2021-09-08 VITALS — BP 111/59 | HR 76 | Temp 97.8°F | Ht 65.0 in | Wt 123.0 lb

## 2021-09-08 DIAGNOSIS — N760 Acute vaginitis: Secondary | ICD-10-CM

## 2021-09-08 DIAGNOSIS — B9689 Other specified bacterial agents as the cause of diseases classified elsewhere: Secondary | ICD-10-CM

## 2021-09-08 MED ORDER — CLINDAMYCIN PHOSPHATE 2 % VA CREA: 1.0000 | TOPICAL_CREAM | Freq: Every day | VAGINAL | 0 refills | Status: AC

## 2021-09-08 NOTE — Progress Notes (Signed)
Acute Office Visit  Subjective:    Patient ID: April Herrera, female    DOB: 1966/02/20, 55 y.o.   MRN: [id number]  Chief Complaint  Patient presents with   Vaginitis    HPI Patient is in today for vaginal irritation and redness x 2 months. She reports a white discharge. She reports a slight odor. She denies urinary symptoms. She denies concern for STDs. She has been treated with clindamycin cream for BV on 08/27/21. She had a normal pelvic exam at this appointment. She reports that this did improve her symptoms but did not resolve. She reports that in the past she has had BV that required 2 rounds of clindamycin for treatment.   Past Medical History:  Diagnosis Date   Hypotension    Osteopenia    Thyroid disease    hypothyroidism    Past Surgical History:  Procedure Laterality Date   arm surgery Right    age 51    BUNIONECTOMY Right    TONSILLECTOMY     age 33    Family History  Problem Relation Age of Onset   Breast cancer Mother    Cancer Father        colon cancer   Hypothyroidism Sister    Hashimoto's thyroiditis Son    Breast cancer Maternal Aunt     Social History   Socioeconomic History   Marital status: Married    Spouse name: Not on file   Number of children: Not on file   Years of education: Not on file   Highest education level: Not on file  Occupational History   Not on file  Tobacco Use   Smoking status: Former    Types: Cigarettes   Smokeless tobacco: Never  Vaping Use   Vaping Use: Never used  Substance and Sexual Activity   Alcohol use: Yes    Alcohol/week: 3.0 standard drinks    Types: 3 Cans of beer per week   Drug use: No   Sexual activity: Not on file  Other Topics Concern   Not on file  Social History Narrative   Not on file   Social Determinants of Health   Financial Resource Strain: Not on file  Food Insecurity: Not on file  Transportation Needs: Not on file  Physical Activity: Not on file  Stress: Not on file   Social Connections: Not on file  Intimate Partner Violence: Not on file    Outpatient Medications Prior to Visit  Medication Sig Dispense Refill   Calcium Citrate-Vitamin D (CALCIUM + D PO) Take by mouth.     fluticasone (FLONASE) 50 MCG/ACT nasal spray Use 2 sprays in each nostril once daily (Patient taking differently: as needed.) 16 g 4   hydrocortisone 1 % lotion Apply 1 application topically 2 (two) times daily. 118 mL 0   levothyroxine (SYNTHROID) 88 MCG tablet Take 1 tablet (88 mcg total) by mouth daily. 90 tablet 2   LYSINE PO Take by mouth daily.     Multiple Vitamins-Minerals (MULTI COMPLETE PO) Take by mouth.     Ospemifene (OSPHENA) 60 MG TABS Take 1 tablet by mouth daily. 90 tablet 3   Quercetin 50 MG TABS 1 tablet     XIIDRA 5 % SOLN place 1 drop into each eye twice daily  3   No facility-administered medications prior to visit.    Allergies  Allergen Reactions   Sulfa Antibiotics     Review of Systems As per HPI.  Objective:    Physical Exam Vitals and nursing note reviewed.  Constitutional:      General: She is not in acute distress.    Appearance: She is not ill-appearing, toxic-appearing or diaphoretic.  Pulmonary:     Effort: Pulmonary effort is normal. No respiratory distress.  Abdominal:     General: There is no distension.     Palpations: Abdomen is soft.     Tenderness: There is no abdominal tenderness. There is no guarding or rebound.  Musculoskeletal:     Right lower leg: No edema.     Left lower leg: No edema.  Skin:    General: Skin is warm and dry.  Neurological:     General: No focal deficit present.     Mental Status: She is alert and oriented to person, place, and time.  Psychiatric:        Mood and Affect: Mood normal.        Behavior: Behavior normal.    BP (!) 111/59    Pulse 76    Temp 97.8 F (36.6 C) (Temporal)    Ht 5\' 5"  (1.651 m)    Wt 123 lb (55.8 kg)    LMP 01/27/2012    BMI 20.47 kg/m  Wt Readings from Last 3  Encounters:  09/08/21 123 lb (55.8 kg)  08/27/21 121 lb 6.4 oz (55.1 kg)  10/26/20 119 lb (54 kg)    Health Maintenance Due  Topic Date Due   Zoster Vaccines- Shingrix (1 of 2) Never done   COVID-19 Vaccine (3 - Booster for Moderna series) 07/11/2020   INFLUENZA VACCINE  04/19/2021   PAP SMEAR-Modifier  04/22/2021    There are no preventive care reminders to display for this patient.   Lab Results  Component Value Date   TSH 1.100 12/11/2020   Lab Results  Component Value Date   WBC 10.1 04/11/2018   HGB 14.0 04/11/2018   HCT 44.1 04/11/2018   MCV 94 04/11/2018   PLT 286 04/11/2018   Lab Results  Component Value Date   NA 133 (L) 03/09/2020   K 4.9 03/09/2020   CO2 24 03/09/2020   GLUCOSE 85 03/09/2020   BUN 9 03/09/2020   CREATININE 0.80 03/09/2020   BILITOT 0.5 03/09/2020   ALKPHOS 76 03/09/2020   AST 27 03/09/2020   ALT 18 03/09/2020   PROT 6.8 03/09/2020   ALBUMIN 4.3 03/09/2020   CALCIUM 9.5 03/09/2020   Lab Results  Component Value Date   CHOL 188 03/09/2020   Lab Results  Component Value Date   HDL 71 03/09/2020   Lab Results  Component Value Date   LDLCALC 97 03/09/2020   Lab Results  Component Value Date   TRIG 113 03/09/2020   Lab Results  Component Value Date   CHOLHDL 2.6 03/09/2020   No results found for: HGBA1C     Assessment & Plan:   April Herrera was seen today for vaginitis.  Diagnoses and all orders for this visit:  Acute vaginitis Bacterial vaginosis Currently no one is available to read wet prep in office. Patient will return tomorrow morning for wet prep. Refill provided on cleocin. Discussed not to start this until wet prep is completed.  -     WET PREP FOR TRICH, YEAST, CLUE; Future -     clindamycin (CLEOCIN) 2 % vaginal cream; Place 1 Applicatorful vaginally at bedtime for 7 days.  Return to office for new or worsening symptoms, or if symptoms persist.  The patient indicates understanding of these issues and agrees  with the plan.   April Earing, FNP

## 2021-09-08 NOTE — Patient Instructions (Signed)

## 2021-09-09 ENCOUNTER — Other Ambulatory Visit: Payer: Self-pay

## 2021-09-09 DIAGNOSIS — N760 Acute vaginitis: Secondary | ICD-10-CM

## 2021-09-09 LAB — WET PREP FOR TRICH, YEAST, CLUE
Clue Cell Exam: NEGATIVE
Trichomonas Exam: NEGATIVE
Yeast Exam: NEGATIVE

## 2021-10-14 ENCOUNTER — Encounter: Payer: Self-pay | Admitting: Family Medicine

## 2021-10-14 DIAGNOSIS — N952 Postmenopausal atrophic vaginitis: Secondary | ICD-10-CM

## 2021-10-15 ENCOUNTER — Ambulatory Visit: Payer: Self-pay | Admitting: Family Medicine

## 2021-10-15 MED ORDER — OSPHENA 60 MG PO TABS: 1.0000 | ORAL_TABLET | Freq: Every day | ORAL | 3 refills | Status: AC

## 2021-10-15 MED ORDER — OSPHENA 60 MG PO TABS: 1.0000 | ORAL_TABLET | Freq: Every day | ORAL | 3 refills | Status: DC

## 2021-10-15 NOTE — Addendum Note (Signed)
Addended by: Fara Olden on: 10/15/2021 03:52 PM   Modules accepted: Orders

## 2022-02-05 ENCOUNTER — Encounter: Payer: Self-pay | Admitting: Family Medicine

## 2022-02-09 ENCOUNTER — Telehealth: Payer: Self-pay | Admitting: Pharmacist

## 2022-02-09 DIAGNOSIS — N952 Postmenopausal atrophic vaginitis: Secondary | ICD-10-CM

## 2022-02-09 NOTE — Telephone Encounter (Signed)
Osphena sent to plan DX N95.2 Tried/failed estradiol

## 2022-02-09 NOTE — Telephone Encounter (Signed)
Approved  02/09/2022 - 02/09/2023

## 2022-05-09 ENCOUNTER — Other Ambulatory Visit: Payer: Self-pay | Admitting: Family Medicine

## 2022-05-09 DIAGNOSIS — Z1231 Encounter for screening mammogram for malignant neoplasm of breast: Secondary | ICD-10-CM

## 2022-05-11 ENCOUNTER — Ambulatory Visit
Admission: RE | Admit: 2022-05-11 | Discharge: 2022-05-11 | Disposition: A | Discharge status: Home / Self Care | Payer: BC Managed Care – PPO | Source: Ambulatory Visit | Attending: Family Medicine | Admitting: Family Medicine

## 2022-05-11 DIAGNOSIS — Z1231 Encounter for screening mammogram for malignant neoplasm of breast: Secondary | ICD-10-CM

## 2022-11-27 ENCOUNTER — Telehealth: Payer: Self-pay | Admitting: Family Medicine

## 2022-11-27 DIAGNOSIS — N952 Postmenopausal atrophic vaginitis: Secondary | ICD-10-CM

## 2022-11-28 ENCOUNTER — Encounter: Payer: Self-pay | Admitting: Family Medicine

## 2022-11-28 NOTE — Telephone Encounter (Signed)
LMTCB to schedule appt for CPE Letter mailed

## 2022-11-28 NOTE — Telephone Encounter (Signed)
Refill denied-pt hasn't been seen in over 1 year. Please schedule pt with PCP for CPE for refills

## 2023-06-16 ENCOUNTER — Other Ambulatory Visit: Payer: Self-pay | Admitting: Family Medicine

## 2023-06-16 DIAGNOSIS — Z1231 Encounter for screening mammogram for malignant neoplasm of breast: Secondary | ICD-10-CM

## 2023-07-07 ENCOUNTER — Ambulatory Visit
Admission: RE | Admit: 2023-07-07 | Discharge: 2023-07-07 | Disposition: A | Discharge status: Home / Self Care | Payer: BC Managed Care – PPO | Source: Ambulatory Visit | Attending: Family Medicine | Admitting: Family Medicine

## 2023-07-07 DIAGNOSIS — Z1231 Encounter for screening mammogram for malignant neoplasm of breast: Secondary | ICD-10-CM

## 2023-12-20 ENCOUNTER — Other Ambulatory Visit: Payer: Self-pay | Admitting: Family Medicine

## 2023-12-20 DIAGNOSIS — M8580 Other specified disorders of bone density and structure, unspecified site: Secondary | ICD-10-CM

## 2024-02-12 ENCOUNTER — Encounter (HOSPITAL_COMMUNITY): Payer: Self-pay | Admitting: *Deleted

## 2024-02-12 ENCOUNTER — Emergency Department (HOSPITAL_COMMUNITY)
Admission: EM | Admit: 2024-02-12 | Discharge: 2024-02-12 | Disposition: A | Discharge status: Home / Self Care | Attending: Emergency Medicine | Admitting: Emergency Medicine

## 2024-02-12 ENCOUNTER — Other Ambulatory Visit: Payer: Self-pay

## 2024-02-12 ENCOUNTER — Emergency Department (HOSPITAL_COMMUNITY)

## 2024-02-12 DIAGNOSIS — R112 Nausea with vomiting, unspecified: Secondary | ICD-10-CM | POA: Diagnosis present

## 2024-02-12 DIAGNOSIS — R197 Diarrhea, unspecified: Secondary | ICD-10-CM | POA: Diagnosis not present

## 2024-02-12 DIAGNOSIS — R1084 Generalized abdominal pain: Secondary | ICD-10-CM | POA: Insufficient documentation

## 2024-02-12 DIAGNOSIS — D72829 Elevated white blood cell count, unspecified: Secondary | ICD-10-CM | POA: Insufficient documentation

## 2024-02-12 LAB — CBC WITH DIFFERENTIAL/PLATELET
Abs Immature Granulocytes: 0 10*3/uL (ref 0.00–0.07)
Basophils Absolute: 0.1 10*3/uL (ref 0.0–0.1)
Basophils Relative: 1 %
Eosinophils Absolute: 2.2 10*3/uL — ABNORMAL HIGH (ref 0.0–0.5)
Eosinophils Relative: 17 %
HCT: 46 % (ref 36.0–46.0)
Hemoglobin: 15.3 g/dL — ABNORMAL HIGH (ref 12.0–15.0)
Lymphocytes Relative: 32 %
Lymphs Abs: 4.2 10*3/uL — ABNORMAL HIGH (ref 0.7–4.0)
MCH: 32.2 pg (ref 26.0–34.0)
MCHC: 33.3 g/dL (ref 30.0–36.0)
MCV: 96.8 fL (ref 80.0–100.0)
Monocytes Absolute: 1.2 10*3/uL — ABNORMAL HIGH (ref 0.1–1.0)
Monocytes Relative: 9 %
Neutro Abs: 5.4 10*3/uL (ref 1.7–7.7)
Neutrophils Relative %: 41 %
Platelets: 386 10*3/uL (ref 150–400)
RBC: 4.75 MIL/uL (ref 3.87–5.11)
RDW: 12.2 % (ref 11.5–15.5)
Smear Review: NORMAL
WBC: 13.2 10*3/uL — ABNORMAL HIGH (ref 4.0–10.5)
nRBC: 0 % (ref 0.0–0.2)

## 2024-02-12 LAB — COMPREHENSIVE METABOLIC PANEL WITH GFR
ALT: 13 U/L (ref 0–44)
AST: 22 U/L (ref 15–41)
Albumin: 4 g/dL (ref 3.5–5.0)
Alkaline Phosphatase: 78 U/L (ref 38–126)
Anion gap: 7 (ref 5–15)
BUN: 9 mg/dL (ref 6–20)
CO2: 28 mmol/L (ref 22–32)
Calcium: 9.5 mg/dL (ref 8.9–10.3)
Chloride: 101 mmol/L (ref 98–111)
Creatinine, Ser: 0.84 mg/dL (ref 0.44–1.00)
GFR, Estimated: >60 mL/min (ref >60)
Glucose, Bld: 70 mg/dL (ref 70–99)
Potassium: 4 mmol/L (ref 3.5–5.1)
Sodium: 136 mmol/L (ref 135–145)
Total Bilirubin: 0.6 mg/dL (ref 0.0–1.2)
Total Protein: 7.2 g/dL (ref 6.5–8.1)

## 2024-02-12 LAB — CBC
HCT: 46.3 % — ABNORMAL HIGH (ref 36.0–46.0)
Hemoglobin: 15.3 g/dL — ABNORMAL HIGH (ref 12.0–15.0)
MCH: 32.3 pg (ref 26.0–34.0)
MCHC: 33 g/dL (ref 30.0–36.0)
MCV: 97.9 fL (ref 80.0–100.0)
Platelets: 370 10*3/uL (ref 150–400)
RBC: 4.73 MIL/uL (ref 3.87–5.11)
RDW: 12.2 % (ref 11.5–15.5)
WBC: 13.3 10*3/uL — ABNORMAL HIGH (ref 4.0–10.5)
nRBC: 0 % (ref 0.0–0.2)

## 2024-02-12 LAB — URINALYSIS, ROUTINE W REFLEX MICROSCOPIC
Bacteria, UA: NONE SEEN
Bilirubin Urine: NEGATIVE
Glucose, UA: NEGATIVE mg/dL
Hgb urine dipstick: NEGATIVE
Ketones, ur: NEGATIVE mg/dL
Nitrite: NEGATIVE
Protein, ur: NEGATIVE mg/dL
Specific Gravity, Urine: 1.008 (ref 1.005–1.030)
pH: 6 (ref 5.0–8.0)

## 2024-02-12 LAB — LIPASE, BLOOD: Lipase: 38 U/L (ref 11–51)

## 2024-02-12 MED ORDER — DICYCLOMINE HCL 10 MG PO CAPS
20.0000 mg | ORAL_CAPSULE | Freq: Once | ORAL | Status: AC
  Administered 2024-02-12: 20 mg via ORAL
  Filled 2024-02-12: qty 2

## 2024-02-12 MED ORDER — DICYCLOMINE HCL 20 MG PO TABS: 20.0000 mg | ORAL_TABLET | Freq: Two times a day (BID) | ORAL | 0 refills | Status: AC

## 2024-02-12 MED ORDER — PROMETHAZINE HCL 25 MG PO TABS: 25.0000 mg | ORAL_TABLET | Freq: Four times a day (QID) | ORAL | 0 refills | Status: AC | PRN

## 2024-02-12 MED ORDER — IOHEXOL 350 MG/ML SOLN
80.0000 mL | Freq: Once | INTRAVENOUS | Status: AC | PRN
  Administered 2024-02-12: 80 mL via INTRAVENOUS

## 2024-02-12 MED ORDER — ONDANSETRON 4 MG PO TBDP: 4.0000 mg | ORAL_TABLET | Freq: Three times a day (TID) | ORAL | 0 refills | Status: AC | PRN

## 2024-02-12 MED ORDER — SIMETHICONE 80 MG PO CHEW
80.0000 mg | CHEWABLE_TABLET | Freq: Once | ORAL | Status: AC
  Administered 2024-02-12: 80 mg via ORAL
  Filled 2024-02-12: qty 1

## 2024-02-12 MED ORDER — ONDANSETRON HCL 4 MG/2ML IJ SOLN
4.0000 mg | Freq: Once | INTRAMUSCULAR | Status: AC
  Administered 2024-02-12: 4 mg via INTRAVENOUS
  Filled 2024-02-12: qty 2

## 2024-02-12 MED ORDER — SIMETHICONE 125 MG PO CHEW: 125.0000 mg | CHEWABLE_TABLET | Freq: Four times a day (QID) | ORAL | 0 refills | Status: AC | PRN

## 2024-02-12 NOTE — Discharge Instructions (Addendum)
 Today you were seen for abdominal pain with nausea, vomiting, diarrhea.  Please pick up your medications and take as prescribed.  Please follow-up with gastroenterology if your symptoms persist for further evaluation and workup.  Return to the ED if you have uncontrollable vomiting, fever that does not go down with Tylenol  or Motrin or blood in your stool.  Thank you for letting us  treat you today. After reviewing your labs and imaging, I feel you are safe to go home. Please follow up with your PCP in the next several days and provide them with your records from this visit. Return to the Emergency Room if pain becomes severe or symptoms worsen.

## 2024-02-12 NOTE — ED Provider Notes (Signed)
 [place] EMERGENCY DEPARTMENT AT [facility] Provider Note   CSN: [id number] Arrival date & time: 02/12/24  1208     History  Chief Complaint  Patient presents with   Abdominal Pain    April Herrera is a 58 y.o. female presents today for abdominal pain with nausea, vomiting, diarrhea that she states began on May 1.  Patient also reports having a fever may 17 to 18th.  Patient endorses bloating, heartburn, cramping, and gas.  Patient denies constipation, dizziness, blood in stool, hematemesis, dysuria, hematuria, shortness of breath, or chest pain.  Patient does not identify any precipitating or relieving factors.  Patient has tried over-the-counter Emetrol and ginger with minimal relief.   Abdominal Pain Associated symptoms: diarrhea, nausea and vomiting        Home Medications Prior to Admission medications   Medication Sig Start Date End Date Taking? Authorizing Provider  Cholecalciferol (VITAMIN D -3 PO) Take by mouth.   Yes [provider]  dicyclomine (BENTYL) 20 MG tablet Take 1 tablet (20 mg total) by mouth 2 (two) times daily. 02/12/24  Yes Taiya Nutting N, PA-C  levothyroxine  (SYNTHROID ) 88 MCG tablet Take 1 tablet (88 mcg total) by mouth daily. 08/27/21  Yes Gottschalk, Ashly M, DO  ondansetron (ZOFRAN-ODT) 4 MG disintegrating tablet Take 1 tablet (4 mg total) by mouth every 8 (eight) hours as needed for nausea or vomiting. 02/12/24  Yes Nataya Bastedo N, PA-C  Ospemifene  (OSPHENA ) 60 MG TABS Take 1 tablet by mouth daily. 10/15/21  Yes Vicky Grange M, DO  promethazine (PHENERGAN) 25 MG tablet Take 1 tablet (25 mg total) by mouth every 6 (six) hours as needed for refractory nausea / vomiting. 02/12/24  Yes Carie Charity, PA-C  simethicone (GAS-X EXTRA STRENGTH) 125 MG chewable tablet Chew 1 tablet (125 mg total) by mouth every 6 (six) hours as needed for flatulence. 02/12/24  Yes Carie Charity, PA-C  XIIDRA 5 % SOLN place 1 drop into each eye twice  daily 06/10/17  Yes [provider]      Allergies    Sulfa antibiotics    Review of Systems   Review of Systems  Gastrointestinal:  Positive for abdominal pain, diarrhea, nausea and vomiting.    Physical Exam Updated Vital Signs BP 129/76   Pulse 63   Temp 98.5 F (36.9 C)   Resp 15   Ht 5\' 5"  (1.651 m)   Wt 52.2 kg   LMP 01/27/2012   SpO2 98%   BMI 19.14 kg/m  Physical Exam Vitals and nursing note reviewed.  Constitutional:      General: She is not in acute distress.    Appearance: She is well-developed and normal weight. She is not ill-appearing, toxic-appearing or diaphoretic.  HENT:     Head: Normocephalic and atraumatic.     Mouth/Throat:     Mouth: Mucous membranes are moist.     Pharynx: Oropharynx is clear.  Eyes:     Extraocular Movements: Extraocular movements intact.     Conjunctiva/sclera: Conjunctivae normal.  Cardiovascular:     Rate and Rhythm: Normal rate and regular rhythm.     Heart sounds: Normal heart sounds. No murmur heard. Pulmonary:     Effort: Pulmonary effort is normal. No respiratory distress.     Breath sounds: Normal breath sounds.  Abdominal:     General: Abdomen is flat. There is no distension.     Palpations: Abdomen is soft.     Tenderness: There is  generalized abdominal tenderness. There is no right CVA tenderness, left CVA tenderness or guarding. Negative signs include Murphy's sign, Rovsing's sign and McBurney's sign.  Musculoskeletal:        General: No swelling.     Cervical back: Neck supple.  Skin:    General: Skin is warm and dry.     Capillary Refill: Capillary refill takes less than 2 seconds.  Neurological:     General: No focal deficit present.     Mental Status: She is alert and oriented to person, place, and time.  Psychiatric:        Mood and Affect: Mood normal.     ED Results / Procedures / Treatments   Labs (all labs ordered are listed, but only abnormal results are displayed) Labs Reviewed   CBC - Abnormal; Notable for the following components:      Result Value   WBC 13.3 (*)    Hemoglobin 15.3 (*)    HCT 46.3 (*)    All other components within normal limits  URINALYSIS, ROUTINE W REFLEX MICROSCOPIC - Abnormal; Notable for the following components:   Leukocytes,Ua TRACE (*)    All other components within normal limits  CBC WITH DIFFERENTIAL/PLATELET - Abnormal; Notable for the following components:   WBC 13.2 (*)    Hemoglobin 15.3 (*)    Lymphs Abs 4.2 (*)    Monocytes Absolute 1.2 (*)    Eosinophils Absolute 2.2 (*)    All other components within normal limits  LIPASE, BLOOD  COMPREHENSIVE METABOLIC PANEL WITH GFR  PATHOLOGIST SMEAR REVIEW    EKG None  Radiology CT ABDOMEN PELVIS W CONTRAST Result Date: 02/12/2024 CLINICAL DATA:  Acute abdominal pain, vomiting, diarrhea, and bloating for several weeks. EXAM: CT ABDOMEN AND PELVIS WITH CONTRAST TECHNIQUE: Multidetector CT imaging of the abdomen and pelvis was performed using the standard protocol following bolus administration of intravenous contrast. RADIATION DOSE REDUCTION: This exam was performed according to the departmental dose-optimization program which includes automated exposure control, adjustment of the mA and/or kV according to patient size and/or use of iterative reconstruction technique. CONTRAST:  80mL OMNIPAQUE  IOHEXOL  350 MG/ML SOLN COMPARISON:  None Available. FINDINGS: Lower Chest: No acute findings. Hepatobiliary: No suspicious hepatic masses identified. Gallbladder is unremarkable. No evidence of biliary ductal dilatation. Pancreas:  No mass or inflammatory changes. Spleen: Within normal limits in size and appearance. Adrenals/Urinary Tract: No suspicious masses identified. No evidence of ureteral calculi or hydronephrosis. 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 No evidence of volvulus, bowel obstruction, inflammatory  process or abnormal fluid collections. Vascular/Lymphatic: No pathologically enlarged lymph nodes. No acute vascular findings. Reproductive:  No mass or other significant abnormality. Other:  None. Musculoskeletal:  No suspicious bone lesions identified. IMPRESSION: No acute findings. Congenital bowel malrotation. No evidence of volvulus or other complication. Electronically Signed   By: Marlyce Sine M.D.   On: 02/12/2024 15:04    Procedures Procedures    Medications Ordered in ED Medications  simethicone  (MYLICON) chewable tablet 80 mg (80 mg Oral Given 02/12/24 1528)  ondansetron  (ZOFRAN ) injection 4 mg (4 mg Intravenous Given 02/12/24 1434)  dicyclomine  (BENTYL ) capsule 20 mg (20 mg Oral Given 02/12/24 1439)  iohexol  (OMNIPAQUE ) 350 MG/ML injection 80 mL (80 mLs Intravenous Contrast Given 02/12/24 1443)    ED Course/ Medical Decision Making/ A&P  Medical Decision Making Amount and/or Complexity of Data Reviewed Labs: ordered.   This patient presents to the ED for concern of abdominal pain with nausea, vomiting, diarrhea differential diagnosis includes viral GI illness, irritable bowel disease, gastroenteritis, diverticulitis, UC, appendicitis, pancreatitis, choledocholithiasis, acute cholecystitis    Additional history obtained  Additional history obtained from Electronic Medical Record External records from outside source obtained and reviewed including Care Everywhere   Lab Tests:  I Ordered, and personally interpreted labs.  The pertinent results include: CMP and lipase WNL, UA with trace leukocytes,   Imaging Studies ordered:  I ordered imaging studies including CT abdomen pelvis with contrast I independently visualized and interpreted imaging which showed no acute findings.  Congenital bowel malrotation.  No evidence of volvulus or other complication. I agree with the radiologist interpretation   Medicines ordered and prescription drug  management:  I ordered medication including Gas-X, Zofran , Bentyl     I have reviewed the patients home medicines and have made adjustments as needed Patient does have some relief of symptoms but still has mild nausea   Problem List / ED Course:  Consider for admission or further workup however patient's vital signs, physical exam, labs, and imaging been reassuring.  Patient given Zofran  and Phenergan  for refractory nausea, Gas-X, and Bentyl .  Patient advised to follow-up with gastroenterology if her symptoms persist.  Patient given return precautions.  I feel patient is safe for discharge at this time.        Final Clinical Impression(s) / ED Diagnoses Final diagnoses:  Nausea vomiting and diarrhea  Generalized abdominal pain    Rx / DC Orders ED Discharge Orders          Ordered    dicyclomine  (BENTYL ) 20 MG tablet  2 times daily        02/12/24 1531    ondansetron  (ZOFRAN -ODT) 4 MG disintegrating tablet  Every 8 hours PRN        02/12/24 1531    promethazine  (PHENERGAN ) 25 MG tablet  Every 6 hours PRN        02/12/24 1531    simethicone  (GAS-X EXTRA STRENGTH) 125 MG chewable tablet  Every 6 hours PRN        02/12/24 1531              Carie Charity, PA-C 02/12/24 1532    Jerilynn Montenegro, MD 02/13/24 2221

## 2024-02-12 NOTE — ED Triage Notes (Signed)
 Pt with abd pain with N/V/D started on May 1, fever May 17-18.  + bloating, heartburn, cramping, gas.

## 2024-03-20 ENCOUNTER — Other Ambulatory Visit (HOSPITAL_BASED_OUTPATIENT_CLINIC_OR_DEPARTMENT_OTHER): Payer: Self-pay | Admitting: Physician Assistant

## 2024-03-20 DIAGNOSIS — M810 Age-related osteoporosis without current pathological fracture: Secondary | ICD-10-CM

## 2024-07-02 ENCOUNTER — Other Ambulatory Visit: Payer: Self-pay | Admitting: Physician Assistant

## 2024-07-02 DIAGNOSIS — Z1231 Encounter for screening mammogram for malignant neoplasm of breast: Secondary | ICD-10-CM

## 2024-09-13 ENCOUNTER — Ambulatory Visit

## 2024-11-04 ENCOUNTER — Other Ambulatory Visit (HOSPITAL_BASED_OUTPATIENT_CLINIC_OR_DEPARTMENT_OTHER)
